# Patient Record
Sex: Male | Born: 1985 | Race: White | Hispanic: No | Marital: Single | Smoking: Current some day smoker
Health system: Southern US, Community
[De-identification: ages and names within clinical notes are randomized; demographics above are authoritative.]

## PROBLEM LIST (undated history)

## (undated) DIAGNOSIS — J45909 Unspecified asthma, uncomplicated: Secondary | ICD-10-CM

## (undated) DIAGNOSIS — IMO0001 Reserved for inherently not codable concepts without codable children: Secondary | ICD-10-CM

## (undated) DIAGNOSIS — F410 Panic disorder [episodic paroxysmal anxiety] without agoraphobia: Secondary | ICD-10-CM

## (undated) DIAGNOSIS — K219 Gastro-esophageal reflux disease without esophagitis: Secondary | ICD-10-CM

## (undated) HISTORY — DX: Panic disorder (episodic paroxysmal anxiety): F41.0

## (undated) HISTORY — DX: Unspecified asthma, uncomplicated: J45.909

## (undated) HISTORY — DX: Gastro-esophageal reflux disease without esophagitis: K21.9

## (undated) HISTORY — DX: Reserved for inherently not codable concepts without codable children: IMO0001

---
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
## 2006-11-03 ENCOUNTER — Emergency Department (HOSPITAL_COMMUNITY): Admission: EM | Admit: 2006-11-03 | Discharge: 2006-11-03 | Payer: Self-pay | Admitting: Emergency Medicine

## 2011-03-03 LAB — CBC
HCT: 49
MCHC: 36
Platelets: 285
RDW: 12

## 2011-03-03 LAB — DIFFERENTIAL
Basophils Absolute: 0
Basophils Relative: 0
Eosinophils Absolute: 0
Eosinophils Relative: 0
Lymphocytes Relative: 10 — ABNORMAL LOW
Lymphs Abs: 1
Monocytes Absolute: 0.3
Monocytes Relative: 3
Neutro Abs: 8.8 — ABNORMAL HIGH
Neutrophils Relative %: 86 — ABNORMAL HIGH

## 2011-03-03 LAB — URINALYSIS, ROUTINE W REFLEX MICROSCOPIC
Bilirubin Urine: NEGATIVE
Glucose, UA: NEGATIVE
Hgb urine dipstick: NEGATIVE
Ketones, ur: 40 — AB
Nitrite: NEGATIVE
Protein, ur: NEGATIVE
Specific Gravity, Urine: 1.02
Urobilinogen, UA: 0.2
pH: 6

## 2011-03-03 LAB — RAPID URINE DRUG SCREEN, HOSP PERFORMED
Amphetamines: NOT DETECTED
Barbiturates: NOT DETECTED
Benzodiazepines: POSITIVE — AB
Cocaine: NOT DETECTED
Opiates: NOT DETECTED
Tetrahydrocannabinol: NOT DETECTED

## 2011-03-03 LAB — ETHANOL: Alcohol, Ethyl (B): <5

## 2011-03-03 LAB — BASIC METABOLIC PANEL WITH GFR
BUN: 10
CO2: 28
Calcium: 9.2
Chloride: 102
Creatinine, Ser: 1.11
GFR calc non Af Amer: >60
Glucose, Bld: 111 — ABNORMAL HIGH
Potassium: 4.2
Sodium: 135

## 2011-06-03 ENCOUNTER — Encounter (HOSPITAL_COMMUNITY): Payer: Self-pay | Admitting: *Deleted

## 2011-06-03 ENCOUNTER — Emergency Department (HOSPITAL_COMMUNITY): Payer: BC Managed Care – PPO

## 2011-06-03 ENCOUNTER — Emergency Department (HOSPITAL_COMMUNITY)
Admission: EM | Admit: 2011-06-03 | Discharge: 2011-06-03 | Disposition: A | Discharge status: Home / Self Care | Payer: BC Managed Care – PPO | Attending: Emergency Medicine | Admitting: Emergency Medicine

## 2011-06-03 DIAGNOSIS — S61219A Laceration without foreign body of unspecified finger without damage to nail, initial encounter: Secondary | ICD-10-CM

## 2011-06-03 DIAGNOSIS — M79609 Pain in unspecified limb: Secondary | ICD-10-CM | POA: Insufficient documentation

## 2011-06-03 DIAGNOSIS — S61209A Unspecified open wound of unspecified finger without damage to nail, initial encounter: Secondary | ICD-10-CM | POA: Insufficient documentation

## 2011-06-03 DIAGNOSIS — W268XXA Contact with other sharp object(s), not elsewhere classified, initial encounter: Secondary | ICD-10-CM | POA: Insufficient documentation

## 2011-06-03 DIAGNOSIS — S62609A Fracture of unspecified phalanx of unspecified finger, initial encounter for closed fracture: Secondary | ICD-10-CM

## 2011-06-03 MED ORDER — TETANUS-DIPHTH-ACELL PERTUSSIS 5-2.5-18.5 LF-MCG/0.5 IM SUSP
INTRAMUSCULAR | Status: AC
  Filled 2011-06-03: qty 0.5

## 2011-06-03 MED ORDER — CEPHALEXIN 500 MG PO CAPS: 500.0000 mg | ORAL_CAPSULE | Freq: Four times a day (QID) | ORAL | Status: AC

## 2011-06-03 MED ORDER — ONDANSETRON 8 MG PO TBDP
8.0000 mg | ORAL_TABLET | Freq: Once | ORAL | Status: AC
  Administered 2011-06-03: 8 mg via ORAL
  Filled 2011-06-03: qty 1

## 2011-06-03 MED ORDER — OXYCODONE-ACETAMINOPHEN 5-325 MG PO TABS: 1.0000 | ORAL_TABLET | ORAL | Status: AC | PRN

## 2011-06-03 MED ORDER — TETANUS-DIPHTHERIA TOXOIDS TD 5-2 LFU IM INJ
INJECTION | INTRAMUSCULAR | Status: AC
  Filled 2011-06-03: qty 0.5

## 2011-06-03 MED ORDER — LIDOCAINE HCL (PF) 1 % IJ SOLN
5.0000 mL | Freq: Once | INTRAMUSCULAR | Status: AC
  Administered 2011-06-03: 5 mL
  Filled 2011-06-03: qty 5

## 2011-06-03 MED ORDER — OXYCODONE-ACETAMINOPHEN 5-325 MG PO TABS
2.0000 | ORAL_TABLET | Freq: Once | ORAL | Status: AC
  Administered 2011-06-03: 0.5 via ORAL
  Filled 2011-06-03 (×2): qty 1

## 2011-06-03 MED ORDER — CEPHALEXIN 500 MG PO CAPS
500.0000 mg | ORAL_CAPSULE | Freq: Once | ORAL | Status: AC
  Administered 2011-06-03: 500 mg via ORAL
  Filled 2011-06-03: qty 1

## 2011-06-03 MED ORDER — TETANUS-DIPHTH-ACELL PERTUSSIS 5-2.5-18.5 LF-MCG/0.5 IM SUSP
0.5000 mL | Freq: Once | INTRAMUSCULAR | Status: AC
  Administered 2011-06-03: 0.5 mL via INTRAMUSCULAR

## 2011-06-03 MED ORDER — TETANUS-DIPHTH-ACELL PERTUSSIS 5-2-15.5 LF-MCG/0.5 IM SUSP: 0.5000 mL | Freq: Once | INTRAMUSCULAR | Status: DC

## 2011-06-03 NOTE — ED Provider Notes (Signed)
History     CSN: [id number]  Arrival date & time 06/03/11  1300   First MD Initiated Contact with Patient 06/03/11 1405      Chief Complaint  Patient presents with  . Laceration     Patient is a 26 y.o. male presenting with skin laceration. The history is provided by the patient.  Laceration  The incident occurred 1 to 2 hours ago. Pain location: right ring finger. The pain is moderate. The pain has been constant since onset. He reports no foreign bodies present. His tetanus status is unknown.  pt reports he was helping a friend work on his car He reports that his finger was lacerated from the belt of the car No other injuries reported He denies cp/back pain/sob/abd pain No weakness is reported  PMH - panic attacks  History reviewed. No pertinent past surgical history.  History reviewed. No pertinent family history.  History  Substance Use Topics  . Smoking status: Former Games developer  . Smokeless tobacco: Not on file  . Alcohol Use: No      Review of Systems  Constitutional: Negative for fever.  Skin: Positive for wound.    Allergies  Review of patient's allergies indicates no known allergies.  Home Medications   Current Outpatient Rx  Name Route Sig Dispense Refill  . PAROXETINE HCL 20 MG PO TABS Oral Take 20 mg by mouth every morning.      BP 154/90  Pulse 99  Temp(Src) 98.3 F (36.8 C) (Oral)  Resp 20  Ht 5\' 10"  (1.778 m)  Wt 230 lb (104.327 kg)  BMI 33.00 kg/m2  SpO2 99%  Physical Exam CONSTITUTIONAL: Well developed/well nourished HEAD AND FACE: Normocephalic/atraumatic EYES: EOMI/PERRL ENMT: Mucous membranes moist NECK: supple no meningeal signs SPINE:entire spine nontender CV: S1/S2 noted, no murmurs/rubs/gallops noted LUNGS: Lungs are clear to auscultation bilaterally, no apparent distress ABDOMEN: soft, nontender, no rebound or guarding GU:no cva tenderness NEURO: Pt is awake/alert, moves all extremitiesx4 EXTREMITIES: pulses normal, full  ROM Right ring finger = laceration noted to distal tip of finger, no exposed bone.  Bleeding controlled No foreign body noted.  The nailbed is intact SKIN: warm, color normal PSYCH: no abnormalities of mood noted  ED Course  NERVE BLOCK Date/Time: 06/03/2011 4:00 PM Performed by: Joya Gaskins Authorized by: Joya Gaskins Consent: Verbal consent obtained. Risks and benefits: risks, benefits and alternatives were discussed Consent given by: patient Indications: pain relief Nerve block body site: right ring finger. Laterality: right Patient sedated: no Preparation: Patient was prepped and draped in the usual sterile fashion. Needle gauge: 24 G Local anesthetic: lidocaine 1% without epinephrine Anesthetic total: 3 ml Outcome: pain improved Patient tolerance: Patient tolerated the procedure well with no immediate complications.     Labs Reviewed - No data to display Dg Finger Ring Right  06/03/2011  *RADIOLOGY REPORT*  Clinical Data: Laceration to right ring finger.  RIGHT RING FINGER 2+V  Comparison: None  Findings: There is a fracture deformity which involves the tuft of the distal phalanx.  The fracture fragments are in near anatomic alignment.  Soft tissue amputation overlying the tuft of the distal phalanx is also noted.  IMPRESSION:  1.  Nondisplaced fracture involves the tuft of the distal phalanx. 2.  Soft tissue amputation overlying the distal phalanx.  Original Report Authenticated By: Rosealee Albee, M.D.   Wound cleansed extensively with high pressure tap water Bleeding controlled No foreign body noted No bony exposed No debridement necessary  3:11 PM D/w dr Hilda Lias Wound care, non-adherent dressing, abx/pain meds and will see next week  4:10 PM D/w dr Merlyn Lot on for hand, cover wound, abx, will see tomorrow  MDM  xrays reviewed and considered         Joya Gaskins, MD 06/03/11 1625

## 2011-06-03 NOTE — ED Notes (Signed)
Pt was helping a coworker work on a car when the coworker started the car and pt's finger became caught in belt, pt has laceration to the right ring finger, top part of finger is missing, pressure dressing applied at triage,

## 2011-06-03 NOTE — ED Notes (Signed)
Pt  Unsure of last tetanus, states that it may have been a few years ago ,.

## 2011-06-03 NOTE — ED Notes (Signed)
Non adhesive dressing applied to right ring finger. Pt instructed to maintain drsg until seen by ortho MD

## 2011-12-12 ENCOUNTER — Ambulatory Visit (INDEPENDENT_AMBULATORY_CARE_PROVIDER_SITE_OTHER): Payer: BC Managed Care – PPO | Admitting: Internal Medicine

## 2011-12-12 VITALS — BP 122/74 | HR 71 | Temp 98.4°F | Resp 18 | Ht 68.0 in | Wt 247.0 lb

## 2011-12-12 DIAGNOSIS — R591 Generalized enlarged lymph nodes: Secondary | ICD-10-CM

## 2011-12-12 DIAGNOSIS — L0291 Cutaneous abscess, unspecified: Secondary | ICD-10-CM

## 2011-12-12 DIAGNOSIS — L0390 Cellulitis, unspecified: Secondary | ICD-10-CM

## 2011-12-12 DIAGNOSIS — R599 Enlarged lymph nodes, unspecified: Secondary | ICD-10-CM

## 2011-12-12 DIAGNOSIS — B009 Herpesviral infection, unspecified: Secondary | ICD-10-CM

## 2011-12-12 MED ORDER — VALACYCLOVIR HCL 1 G PO TABS: 1000.0000 mg | ORAL_TABLET | Freq: Three times a day (TID) | ORAL | Status: DC

## 2011-12-12 NOTE — Progress Notes (Signed)
  Subjective:    Patient ID: Jay Garcia, male    DOB: 04-30-1986, 26 y.o.   MRN: [id number]  HPIStarted with a little discomfort on Wednesday/on Thursday noticed red bumps on his left upper lid Has had pain and swelling and redness in the upper early at with tenderness in the lymph nodes around his left ear and jaw No tingling or numbness No purulent discharge No change in vision No pain in the eye itself   78-year-old at home/Wife works at [employer] Review of Systems Not immunocompromised No history of herpes zoster or herpes simplex No problems in the past On Paxil for panic disorder and Ritalin for ADD    Objective:   Physical Exam Vital signs stable There is a group of erythematous purulent vesicles on the left upper inner lid Conjunctiva on the left is slightly injected After Ophthaine stain is applied/no dendritic lesions and no areas of uptake Vision is intact Pupils are equal round reactive to light and accommodation/EOMs conjugate Only the left upper lid is swollen There are no paresthesias in the distribution of the trigeminal nerve on the left Tender preauricular node Tender anterior cervical noted at the angle of the jaw No oral lesions or lesions in the external auditory canal       Assessment & Plan:  Impression #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 Sol Blazing And we'll make arrangements for the referral

## 2011-12-15 LAB — HERPES SIMPLEX VIRUS CULTURE: Organism ID, Bacteria: DETECTED

## 2012-10-11 ENCOUNTER — Encounter: Payer: Self-pay | Admitting: *Deleted

## 2012-10-16 ENCOUNTER — Encounter: Payer: Self-pay | Admitting: Family Medicine

## 2012-10-16 ENCOUNTER — Ambulatory Visit (INDEPENDENT_AMBULATORY_CARE_PROVIDER_SITE_OTHER): Payer: BC Managed Care – PPO | Admitting: Family Medicine

## 2012-10-16 VITALS — BP 132/90 | Temp 98.7°F | Wt 228.8 lb

## 2012-10-16 DIAGNOSIS — L559 Sunburn, unspecified: Secondary | ICD-10-CM

## 2012-10-16 DIAGNOSIS — F909 Attention-deficit hyperactivity disorder, unspecified type: Secondary | ICD-10-CM

## 2012-10-16 DIAGNOSIS — F410 Panic disorder [episodic paroxysmal anxiety] without agoraphobia: Secondary | ICD-10-CM | POA: Insufficient documentation

## 2012-10-16 MED ORDER — PREDNISONE 20 MG PO TABS: ORAL_TABLET | ORAL | Status: DC

## 2012-10-16 MED ORDER — PAROXETINE HCL 20 MG PO TABS: 20.0000 mg | ORAL_TABLET | ORAL | Status: DC

## 2012-10-16 NOTE — Progress Notes (Signed)
  Subjective:    Patient ID: Jay Garcia, male    DOB: 09/01/1985, 27 y.o.   MRN: [id number]  HPI  adhd had trouble focusing and attention back in school. Was placed on medications. If found that Ritalin helped considerably.  Hx of panic disorder. Hx of severe anxiety and panic attacks. Off the med has difficulty. Had to use of Xanax early on. Now does not. No suicidal thoughts. Without the medication has a lot of difficulty.  Originally started seeing specialist cause less costly, now more so Severe sunburn of late. Got out this weekend did not wear block as much as he should. Review of Systems No chest pain no abdominal pain no shortness of breath    Objective:   Physical Exam Alert no acute distress. HEENT normal. Vitals reviewed. Lungs clear. Heart regular in rhythm. Abdomen benign. Severe sunburn noted with secondary blistering       Assessment & Plan:  Impression 1 ADHD discussed at length. Currently under good control with 10 mg what Ritalin twice per day. #2 panic attack with anxiety clinically stable on Paxil. #3 sunburn severe. Discussed. Plan prednisone taper. 4 Paxil refilled. Diet exercise discussed. Recheck in 4 months. WSLmonths worth written a will and in written.

## 2012-11-13 ENCOUNTER — Encounter: Payer: Self-pay | Admitting: Family Medicine

## 2012-11-13 ENCOUNTER — Ambulatory Visit (INDEPENDENT_AMBULATORY_CARE_PROVIDER_SITE_OTHER): Payer: BC Managed Care – PPO | Admitting: Family Medicine

## 2012-11-13 VITALS — BP 128/78 | Temp 98.4°F | Wt 222.0 lb

## 2012-11-13 DIAGNOSIS — J4550 Severe persistent asthma, uncomplicated: Secondary | ICD-10-CM

## 2012-11-13 DIAGNOSIS — J45909 Unspecified asthma, uncomplicated: Secondary | ICD-10-CM | POA: Insufficient documentation

## 2012-11-13 DIAGNOSIS — J329 Chronic sinusitis, unspecified: Secondary | ICD-10-CM

## 2012-11-13 MED ORDER — ALBUTEROL SULFATE HFA 108 (90 BASE) MCG/ACT IN AERS: 2.0000 | INHALATION_SPRAY | Freq: Four times a day (QID) | RESPIRATORY_TRACT | Status: DC | PRN

## 2012-11-13 MED ORDER — AMOXICILLIN 500 MG PO TABS: ORAL_TABLET | ORAL | Status: DC

## 2012-11-13 NOTE — Progress Notes (Signed)
  Subjective:    Patient ID: Jay Garcia, male    DOB: 11/23/1985, 27 y.o.   MRN: [id number]  Sinusitis This is a new problem. The current episode started in the past 7 days. The problem has been gradually worsening since onset. His pain is at a severity of 5/10. The pain is moderate. Associated symptoms include coughing, sinus pressure and a sore throat.   Cough productive of gunk.  Frontal headache. Diminished energy.  Nausea off and on. along with diarrhea  Review of Systems  HENT: Positive for sore throat and sinus pressure.   Respiratory: Positive for cough.        Objective:   Physical Exam An in alert mild malaise. Vitals reviewed. Frontal tenderness. Pharynx erythematous. Lungs rare wheeze heart regular rate and rhythm.       Assessment & Plan:  Impression rhinosinusitis and bronchitis with exacerbation of reactive airways. Plan a mocks 500 3 times a day, Ventolin inhaler when necessary, and nightly Hycodan when necessary. WSL

## 2012-11-21 ENCOUNTER — Other Ambulatory Visit: Payer: Self-pay | Admitting: Internal Medicine

## 2012-11-22 ENCOUNTER — Other Ambulatory Visit: Payer: Self-pay | Admitting: *Deleted

## 2012-11-22 MED ORDER — AMOXICILLIN 500 MG PO TABS: ORAL_TABLET | ORAL | Status: AC

## 2012-11-22 NOTE — Telephone Encounter (Signed)
Needs OV, or needs to get from PCP

## 2013-02-12 ENCOUNTER — Encounter: Payer: Self-pay | Admitting: Family Medicine

## 2013-02-12 ENCOUNTER — Ambulatory Visit (INDEPENDENT_AMBULATORY_CARE_PROVIDER_SITE_OTHER): Payer: BC Managed Care – PPO | Admitting: Family Medicine

## 2013-02-12 VITALS — BP 128/90 | Temp 98.5°F | Ht 70.0 in | Wt 226.0 lb

## 2013-02-12 DIAGNOSIS — J329 Chronic sinusitis, unspecified: Secondary | ICD-10-CM

## 2013-02-12 MED ORDER — LEVOFLOXACIN 500 MG PO TABS: 500.0000 mg | ORAL_TABLET | Freq: Every day | ORAL | Status: AC

## 2013-02-12 MED ORDER — ALBUTEROL SULFATE HFA 108 (90 BASE) MCG/ACT IN AERS: 2.0000 | INHALATION_SPRAY | Freq: Four times a day (QID) | RESPIRATORY_TRACT | Status: DC | PRN

## 2013-02-12 NOTE — Progress Notes (Signed)
  Subjective:    Patient ID: Jay Garcia, male    DOB: Nov 14, 1985, 27 y.o.   MRN: [id number]  Fever  This is a new problem. The current episode started in the past 7 days. Associated symptoms include coughing, headaches and wheezing. Associated symptoms comments: Runny nose, dizzy, body aches.  worrse cough and wheezing in the chest,  Sig pressure ant face and forehead   Review of Systems  Constitutional: Positive for fever.  Respiratory: Positive for cough and wheezing.   Neurological: Positive for headaches.       Objective:   Physical Exam Alert mild malaise. Vital stable. Lungs clear. Heart regular in rhythm. H&T frontal and maxillary tenderness evident pharynx slight erythema       Assessment & Plan:  Impression rhinosinusitis plan Levaquin daily 10 days. Symptomatic care discussed. Warning signs discussed. WSL

## 2013-02-12 NOTE — Progress Notes (Deleted)
  Subjective:    Patient ID: Jay Garcia, male    DOB: November 17, 1985, 27 y.o.   MRN: [id number]  Cough This is a new problem. Associated symptoms comments: Pulling at ears.   Rash on stomach.   Review of Systems  Respiratory: Positive for cough.        Objective:   Physical Exam        Assessment & Plan:

## 2013-02-16 ENCOUNTER — Telehealth: Payer: Self-pay | Admitting: Family Medicine

## 2013-02-16 MED ORDER — METHYLPHENIDATE HCL 10 MG PO TABS: 10.0000 mg | ORAL_TABLET | Freq: Two times a day (BID) | ORAL | Status: DC

## 2013-02-16 NOTE — Telephone Encounter (Signed)
Last ADD check up 10/16/12

## 2013-02-16 NOTE — Telephone Encounter (Signed)
Rx printed and left up front for patient pick up. Patient notified. 

## 2013-02-16 NOTE — Telephone Encounter (Signed)
Patient needs Rx for Ritalin °

## 2013-02-16 NOTE — Telephone Encounter (Signed)
wis one month's worth--ov before finished

## 2013-02-21 ENCOUNTER — Ambulatory Visit (INDEPENDENT_AMBULATORY_CARE_PROVIDER_SITE_OTHER): Payer: BC Managed Care – PPO | Admitting: Family Medicine

## 2013-02-21 ENCOUNTER — Encounter: Payer: Self-pay | Admitting: Family Medicine

## 2013-02-21 ENCOUNTER — Telehealth: Payer: Self-pay | Admitting: Family Medicine

## 2013-02-21 VITALS — BP 130/94 | Temp 98.9°F | Ht 70.0 in | Wt 220.8 lb

## 2013-02-21 DIAGNOSIS — R21 Rash and other nonspecific skin eruption: Secondary | ICD-10-CM

## 2013-02-21 DIAGNOSIS — J329 Chronic sinusitis, unspecified: Secondary | ICD-10-CM

## 2013-02-21 MED ORDER — TRIAMCINOLONE ACETONIDE 0.1 % EX CREA: TOPICAL_CREAM | Freq: Two times a day (BID) | CUTANEOUS | Status: DC

## 2013-02-21 MED ORDER — AMOXICILLIN-POT CLAVULANATE 875-125 MG PO TABS: 1.0000 | ORAL_TABLET | Freq: Two times a day (BID) | ORAL | Status: AC

## 2013-02-21 NOTE — Progress Notes (Signed)
  Subjective:    Patient ID: Jay Garcia, male    DOB: 27-May-1985, 27 y.o.   MRN: [id number]  Rash This is a new problem. The current episode started in the past 7 days. The affected locations include the face, left arm, left ear, left hand, right hand and right arm. The rash is characterized by itchiness and burning. He was exposed to nothing. Associated symptoms include coughing. Past treatments include antibiotics.   Patient was here last week and was prescribed antibiotics for a respiratory infection.  He also states that his left ear is hot to the touch.  Two d ago rash appeared, noted a know, more iritaate, felt bumps and felt warm  Fine rash now on arms and face , no major itching  Ear felt like  A bite  Patient states that he feels his immune system must be control.  Patient was told that he shingles twice in his left eye. He had a culture done. He had to take acyclovir.had  No topical agents Review of Systems  Respiratory: Positive for cough.   Skin: Positive for rash.   ROS otherwise negative     Objective:   Physical Exam  Alert no acute distress. Lungs clear. Heart regular rate and rhythm. Arms very fine maculopapular rash. Frontal maxillary tenderness persists. 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 Stop Levaquin. New antibiotic prescribed. Triamcinolone twice a day tear. WSL     Assessme:

## 2013-02-21 NOTE — Telephone Encounter (Signed)
Pt calling stating he was seen last week, given a script for Levoquin. He started coughing up blood this morning (just a faint tinge) and wants to know if he should be concerned. He was also bit by a bug/spider yesterday and now has developed a rash all over his arms/hands, ear is swollen with bumps all over it, hot feeling.

## 2013-03-26 ENCOUNTER — Encounter: Payer: BC Managed Care – PPO | Admitting: Family Medicine

## 2013-04-05 ENCOUNTER — Ambulatory Visit (INDEPENDENT_AMBULATORY_CARE_PROVIDER_SITE_OTHER): Payer: BC Managed Care – PPO | Admitting: Family Medicine

## 2013-04-05 ENCOUNTER — Encounter: Payer: Self-pay | Admitting: Family Medicine

## 2013-04-05 VITALS — BP 130/80 | Ht 70.0 in | Wt 227.0 lb

## 2013-04-05 DIAGNOSIS — J45909 Unspecified asthma, uncomplicated: Secondary | ICD-10-CM

## 2013-04-05 DIAGNOSIS — F909 Attention-deficit hyperactivity disorder, unspecified type: Secondary | ICD-10-CM

## 2013-04-05 DIAGNOSIS — J329 Chronic sinusitis, unspecified: Secondary | ICD-10-CM

## 2013-04-05 DIAGNOSIS — F410 Panic disorder [episodic paroxysmal anxiety] without agoraphobia: Secondary | ICD-10-CM

## 2013-04-05 DIAGNOSIS — Z23 Encounter for immunization: Secondary | ICD-10-CM

## 2013-04-05 MED ORDER — METHYLPHENIDATE HCL ER (OSM) 27 MG PO TBCR: 27.0000 mg | EXTENDED_RELEASE_TABLET | ORAL | Status: DC

## 2013-04-05 MED ORDER — AMOXICILLIN-POT CLAVULANATE 875-125 MG PO TABS: 1.0000 | ORAL_TABLET | Freq: Two times a day (BID) | ORAL | Status: AC

## 2013-04-05 NOTE — Progress Notes (Signed)
  Subjective:    Patient ID: Jay Garcia, male    DOB: Oct 13, 1985, 27 y.o.   MRN: [id number]  HPI Patient arrives for an ADHD check up. Patient would like to discuss being on a longer acting med to help him from the day more.   ADHd the reg ritalin, causes short fast rxn and then probably crashes after it  Ran out of the ritalin, on generic paxil.Adderall caused problems, went to rapid acting in the past  Breathing ok, went to tournament and got exposed to the cold. Doesn't do flue shots, but willing to consider.  Headache cough cold productive of yellowish phlegm diminished energy.  Reports mood overall is stable but still has tendency towards anxiety and panic. Also one tries to get off the Paxil has some symptomatology.  States wheezing overall stable uses inhaler only rarely.  Review of Systems No chest pain no back pain no abdominal pain no nausea or diaphoresis no shortness breath ROS otherwise negative    Objective:   Physical Exam Alert lungs clear. Heart regular in rhythm H&T moderate nasal congestion frontal tenderness. Neuro intact. Somewhat anxious appearing.       Assessment & Plan:  Impression ADHD long discussion held. Patient would like to a long-acting form. This is reasonable. #2 rhinosinusitis discussed #3 chronic anxiety with panic attacks clinically stable. #4 asthma clinically stable Plan stop Ritalin. Start Concerta 27 mg by mouth every morning. 4 months worth written. Augmentin twice a day 10 days. Flu shot today. Recheck in 4 months. WSL

## 2013-05-18 ENCOUNTER — Telehealth: Payer: Self-pay | Admitting: Family Medicine

## 2013-05-18 MED ORDER — ONDANSETRON 8 MG PO TBDP: 8.0000 mg | ORAL_TABLET | Freq: Three times a day (TID) | ORAL | Status: DC | PRN

## 2013-05-18 NOTE — Telephone Encounter (Signed)
Fever off and on for 2 days, not sure how high he can just tell he has fever, taking ibuprofen, he is drinking fluids, not eating much today he has ate 3-4 crackers, did not eat yesterday, has only urinated one time today none yesterday. Vomiting and diarrhea 10- 15 times a day until today. He has not voimitied today still having diarhhea. No blood in diarrhea or vomit but he is coughing up dark mucus. Consult with Dr. Lorin PicketScott call in zofran 8mg  #18 oneTID and OTC immodium. If no better by am needs to go to ER.

## 2013-05-18 NOTE — Telephone Encounter (Signed)
PT has had vomiting for 2 days now, can we call something in for this? Like zofran?   Also his eyes seem to have burst blood vessels in them now from the  Vomiting. Is there anything we can do for this?  [employer]

## 2013-05-18 NOTE — Telephone Encounter (Signed)
NTC- discuss case, warning signs? Fever, bloody stool or bloody vomitis? Sipping liuquids? ( Zofran can be sent in ) NTC first

## 2013-05-21 ENCOUNTER — Ambulatory Visit (INDEPENDENT_AMBULATORY_CARE_PROVIDER_SITE_OTHER): Payer: BC Managed Care – PPO | Admitting: Family Medicine

## 2013-05-21 ENCOUNTER — Encounter: Payer: Self-pay | Admitting: Family Medicine

## 2013-05-21 ENCOUNTER — Other Ambulatory Visit: Payer: Self-pay | Admitting: Family Medicine

## 2013-05-21 VITALS — BP 130/88 | Temp 98.4°F | Ht 70.0 in | Wt 220.0 lb

## 2013-05-21 DIAGNOSIS — H00039 Abscess of eyelid unspecified eye, unspecified eyelid: Secondary | ICD-10-CM

## 2013-05-21 DIAGNOSIS — B349 Viral infection, unspecified: Secondary | ICD-10-CM

## 2013-05-21 DIAGNOSIS — R112 Nausea with vomiting, unspecified: Secondary | ICD-10-CM

## 2013-05-21 DIAGNOSIS — B9789 Other viral agents as the cause of diseases classified elsewhere: Secondary | ICD-10-CM

## 2013-05-21 DIAGNOSIS — H00036 Abscess of eyelid left eye, unspecified eyelid: Secondary | ICD-10-CM

## 2013-05-21 LAB — POCT URINALYSIS DIPSTICK
PH UA: 7
Urobilinogen, UA: 4

## 2013-05-21 MED ORDER — ONDANSETRON 8 MG PO TBDP
8.0000 mg | ORAL_TABLET | Freq: Three times a day (TID) | ORAL | Status: DC | PRN
Start: 2013-05-21 — End: 2014-11-08

## 2013-05-21 NOTE — Progress Notes (Signed)
   Subjective:    Patient ID: Jay Garcia, male    DOB: 10/12/1985, 28 y.o.   MRN: [id number]  HPIHaving vomiting, diarrhea, and fever for 5-6 days. Patient also with some runny nose sore throat slight cough flulike illness for several days he states he started to feel better other family members had it but not as bad as he had  Blood vessels in both eyes started after the vomiting. This occurred after heavy vomiting.   Painful bump above left eye. Pt states he has had shingles around eye before. Patient would like to have this test.    Review of Systems He relates he had a few days where he did urinate well but now he is urinating better. He also states he is able to keep some things down. He-related mainly flulike symptoms    Objective:   Physical Exam He has scleral hemorrhage is consistent with his vomiting. Should gradually get better he also has a bump over the left thigh which we cultured for varus Sal as well as MRSA await results of the culture Lungs clear hearts regular pulse normal extremities no edema skin warm dry neurologic grossly normal Urine does not show ketones Patient is not toxic neck is supple color is good     Assessment & Plan:  Viral syndrome probable flu gradually getting better I think the next approach for this gentleman is Zofran plenty of liquids and if not turning the corner over the next 48 hours he will need some lab work. Warning signs was discussed with the patient.

## 2013-05-24 LAB — WOUND CULTURE
Gram Stain: NONE SEEN
Gram Stain: NONE SEEN
Gram Stain: NONE SEEN
ORGANISM ID, BACTERIA: NO GROWTH

## 2013-05-28 LAB — HERPES CULTURE, RAPID: ORGANISM ID, BACTERIA: NEGATIVE

## 2013-05-29 ENCOUNTER — Encounter: Payer: Self-pay | Admitting: Family Medicine

## 2013-09-05 ENCOUNTER — Telehealth: Payer: Self-pay | Admitting: Family Medicine

## 2013-09-05 NOTE — Telephone Encounter (Signed)
Pt requesting refill  methylphenidate (CONCERTA) 27 MG CR tablet   Please call when ready [phone]

## 2013-09-05 NOTE — Telephone Encounter (Signed)
Last seen: 11/14/

## 2013-09-05 NOTE — Telephone Encounter (Signed)
Ok times one mo, needs ov

## 2013-09-06 MED ORDER — METHYLPHENIDATE HCL ER (OSM) 27 MG PO TBCR: 27.0000 mg | EXTENDED_RELEASE_TABLET | ORAL | Status: DC

## 2013-09-06 NOTE — Telephone Encounter (Signed)
Pt notified script ready for pick up and he needs office visit before further.

## 2013-09-06 NOTE — Telephone Encounter (Signed)
[facility] script ready for pickup. Script at front desk.

## 2013-09-18 ENCOUNTER — Encounter: Payer: Self-pay | Admitting: Family Medicine

## 2013-09-18 ENCOUNTER — Ambulatory Visit (INDEPENDENT_AMBULATORY_CARE_PROVIDER_SITE_OTHER): Payer: BC Managed Care – PPO | Admitting: Family Medicine

## 2013-09-18 VITALS — Ht 70.0 in

## 2013-09-18 DIAGNOSIS — J45909 Unspecified asthma, uncomplicated: Secondary | ICD-10-CM

## 2013-09-18 DIAGNOSIS — F410 Panic disorder [episodic paroxysmal anxiety] without agoraphobia: Secondary | ICD-10-CM

## 2013-09-18 DIAGNOSIS — F909 Attention-deficit hyperactivity disorder, unspecified type: Secondary | ICD-10-CM

## 2013-09-18 MED ORDER — ONDANSETRON HCL 8 MG PO TABS: 8.0000 mg | ORAL_TABLET | Freq: Three times a day (TID) | ORAL | Status: DC | PRN

## 2013-09-18 MED ORDER — DIPHENOXYLATE-ATROPINE 2.5-0.025 MG PO TABS: 1.0000 | ORAL_TABLET | Freq: Four times a day (QID) | ORAL | Status: DC | PRN

## 2013-09-18 NOTE — Progress Notes (Signed)
   Subjective:    Patient ID: Jay Garcia, male    DOB: 10/20/1985, 28 y.o.   MRN: [id number]  Emesis  This is a new problem. The current episode started yesterday. Associated symptoms include diarrhea.    Felt really bad with vom and diarrhea  Got to work and started with bad nausea and then threw up again, ande felt nauseated  Tried to force the vomiting  Pushed back of the tongue and felt knots and bumps   Started to worry  Felt clammy cool and hot, felt sweaty  No other exp in the past  Patient extremely anxious about the bumps that he has found that the back of his tongue. Worried about cancer.   Review of Systems  Gastrointestinal: Positive for vomiting and diarrhea.   no chest pain no back pain no abdominal pain no rash no change in bowel habits no blood in stool ROS otherwise negative     Objective:   Physical Exam  Alert moderate malaise anxious appearing HEENT normal pharyngeal anatomy. Normal tongue anatomy. Neck supple. Lungs clear. Heart rare in rhythm. Abdomen hyperactive bowel sounds. Diffuse upper abdominal mild discomfort to palpation no acute tenderness no rebound no guarding      Assessment & Plan:  Impression 1 acute gastroenteritis discussed #2 normal tongue anatomy with the patient anxiety #3 chronic anxiety discussed plan multiple questions answered. 25 minutes spent most in discussion. Warning signs discussed. Symptomatic care discussed. Zofran when necessary for nausea. Lomotil when necessary for any diarrhea. Dietary changes discussed. 25 minutes spent most in discussing concerns. WSL

## 2013-09-28 ENCOUNTER — Other Ambulatory Visit: Payer: Self-pay | Admitting: Family Medicine

## 2013-11-13 ENCOUNTER — Ambulatory Visit: Payer: BC Managed Care – PPO | Admitting: Nurse Practitioner

## 2014-01-30 ENCOUNTER — Other Ambulatory Visit: Payer: Self-pay | Admitting: Family Medicine

## 2014-03-13 ENCOUNTER — Other Ambulatory Visit: Payer: Self-pay | Admitting: Family Medicine

## 2014-06-20 ENCOUNTER — Other Ambulatory Visit: Payer: Self-pay | Admitting: Family Medicine

## 2014-08-05 ENCOUNTER — Other Ambulatory Visit: Payer: Self-pay | Admitting: Family Medicine

## 2014-11-08 ENCOUNTER — Encounter: Payer: Self-pay | Admitting: Family Medicine

## 2014-11-08 ENCOUNTER — Ambulatory Visit (INDEPENDENT_AMBULATORY_CARE_PROVIDER_SITE_OTHER): Payer: Self-pay | Admitting: Family Medicine

## 2014-11-08 VITALS — BP 122/80 | Temp 98.4°F | Ht 70.0 in | Wt 199.0 lb

## 2014-11-08 DIAGNOSIS — J019 Acute sinusitis, unspecified: Secondary | ICD-10-CM

## 2014-11-08 DIAGNOSIS — F410 Panic disorder [episodic paroxysmal anxiety] without agoraphobia: Secondary | ICD-10-CM

## 2014-11-08 DIAGNOSIS — J4520 Mild intermittent asthma, uncomplicated: Secondary | ICD-10-CM

## 2014-11-08 MED ORDER — CITALOPRAM HYDROBROMIDE 20 MG PO TABS: 20.0000 mg | ORAL_TABLET | Freq: Every day | ORAL | Status: DC

## 2014-11-08 MED ORDER — ALBUTEROL SULFATE HFA 108 (90 BASE) MCG/ACT IN AERS: 2.0000 | INHALATION_SPRAY | Freq: Four times a day (QID) | RESPIRATORY_TRACT | Status: DC | PRN

## 2014-11-08 MED ORDER — AZITHROMYCIN 250 MG PO TABS: ORAL_TABLET | ORAL | Status: DC

## 2014-11-08 MED ORDER — NYSTATIN 100000 UNIT/ML MT SUSP: 5.0000 mL | Freq: Four times a day (QID) | OROMUCOSAL | Status: DC

## 2014-11-08 NOTE — Progress Notes (Signed)
   Subjective:    Patient ID: Jay Garcia, male    DOB: June 28, 1985, 29 y.o.   MRN: [id number]  Cough This is a new problem. Episode onset: 4 -5 days ago. Associated symptoms include headaches, nasal congestion, shortness of breath and wheezing. Treatments tried: mucinex dm.   Pt states he does not have insurance so he has not had his concerta and paxil in months. He states he is having withdrawal symptoms.   Yeast infection in mouth for the past 6 months - 1 year. Has tried home remedies and otc meds.   Review of Systems  Respiratory: Positive for cough, shortness of breath and wheezing.   Neurological: Positive for headaches.       Objective:   Physical Exam  Eardrums normal throat is normal possible white patches on tongue minimal low. Lungs bilateral expiratory wheezes chest congestion noted no sign of pneumonia heart regular mild sinus tenderness     patient admits to being anxious and nervous Assessment & Plan:  Anxiety condition start Celexa 20 mg half tablet daily then 1 tablet daily he was on Paxil but he would get withdrawals every time he stopped it.  Reactive airway not severe. Only when he doesn't deep exhalation. I recommend albuterol 2 puffs every 4 hours when necessary I don't feel need to be on prednisone currently if not improved over the next few days call us  Combination sinus infection or bronchitis Zithromax 5 days as directed  Possible thrush nystatin  I discussed with patient importance of following up with Dr. Brett Canales to discuss his anxiety issues he's been without insurance and been all medicines for over 4 months. He was on. Patient denies being depressed.

## 2014-11-08 NOTE — Addendum Note (Signed)
Addended by: Margaretha Sheffield on: 11/08/2014 02:43 PM   Modules accepted: Orders

## 2014-12-13 ENCOUNTER — Ambulatory Visit: Payer: Self-pay | Admitting: Family Medicine

## 2015-05-13 ENCOUNTER — Encounter: Payer: Self-pay | Admitting: Nurse Practitioner

## 2015-05-13 ENCOUNTER — Encounter: Payer: Self-pay | Admitting: Family Medicine

## 2015-05-13 ENCOUNTER — Ambulatory Visit (INDEPENDENT_AMBULATORY_CARE_PROVIDER_SITE_OTHER): Payer: PRIVATE HEALTH INSURANCE | Admitting: Nurse Practitioner

## 2015-05-13 VITALS — BP 118/78 | Temp 99.6°F | Ht 70.0 in | Wt 194.5 lb

## 2015-05-13 DIAGNOSIS — J0100 Acute maxillary sinusitis, unspecified: Secondary | ICD-10-CM | POA: Diagnosis not present

## 2015-05-13 DIAGNOSIS — K219 Gastro-esophageal reflux disease without esophagitis: Secondary | ICD-10-CM

## 2015-05-13 MED ORDER — ALBUTEROL SULFATE HFA 108 (90 BASE) MCG/ACT IN AERS: 2.0000 | INHALATION_SPRAY | Freq: Four times a day (QID) | RESPIRATORY_TRACT | Status: DC | PRN

## 2015-05-13 MED ORDER — RANITIDINE HCL 300 MG PO TABS: 300.0000 mg | ORAL_TABLET | Freq: Every day | ORAL | Status: DC

## 2015-05-13 MED ORDER — AZITHROMYCIN 250 MG PO TABS: ORAL_TABLET | ORAL | Status: DC

## 2015-05-13 NOTE — Patient Instructions (Signed)
Food Choices for Gastroesophageal Reflux Disease, Adult When you have gastroesophageal reflux disease (GERD), the foods you eat and your eating habits are very important. Choosing the right foods can help ease the discomfort of GERD. WHAT GENERAL GUIDELINES DO I NEED TO FOLLOW?  Choose fruits, vegetables, whole grains, low-fat dairy products, and low-fat meat, fish, and poultry.  Limit fats such as oils, salad dressings, butter, nuts, and avocado.  Keep a food diary to identify foods that cause symptoms.  Avoid foods that cause reflux. These may be different for different people.  Eat frequent small meals instead of three large meals each day.  Eat your meals slowly, in a relaxed setting.  Limit fried foods.  Cook foods using methods other than frying.  Avoid drinking alcohol.  Avoid drinking large amounts of liquids with your meals.  Avoid bending over or lying down until 2-3 hours after eating. WHAT FOODS ARE NOT RECOMMENDED? The following are some foods and drinks that may worsen your symptoms: Vegetables Tomatoes. Tomato juice. Tomato and spaghetti sauce. Chili peppers. Onion and garlic. Horseradish. Fruits Oranges, grapefruit, and lemon (fruit and juice). Meats High-fat meats, fish, and poultry. This includes hot dogs, ribs, ham, sausage, salami, and bacon. Dairy Whole milk and chocolate milk. Sour cream. Cream. Butter. Ice cream. Cream cheese.  Beverages Coffee and tea, with or without caffeine. Carbonated beverages or energy drinks. Condiments Hot sauce. Barbecue sauce.  Sweets/Desserts Chocolate and cocoa. Donuts. Peppermint and spearmint. Fats and Oils High-fat foods, including French fries and potato chips. Other Vinegar. Strong spices, such as black pepper, white pepper, red pepper, cayenne, curry powder, cloves, ginger, and chili powder. The items listed above may not be a complete list of foods and beverages to avoid. Contact your dietitian for more  information.   This information is not intended to replace advice given to you by your health care provider. Make sure you discuss any questions you have with your health care provider.   Document Released: 05/03/2005 Document Revised: 05/24/2014 Document Reviewed: 03/07/2013 Elsevier Interactive Patient Education 2016 [employer]  

## 2015-05-14 ENCOUNTER — Other Ambulatory Visit: Payer: Self-pay | Admitting: Family Medicine

## 2015-05-14 ENCOUNTER — Encounter: Payer: Self-pay | Admitting: Nurse Practitioner

## 2015-05-14 DIAGNOSIS — K219 Gastro-esophageal reflux disease without esophagitis: Secondary | ICD-10-CM | POA: Insufficient documentation

## 2015-05-14 NOTE — Progress Notes (Signed)
Subjective:  Presents for c/o cough, fever and congestion x 4 d. Sore throat. Maxillary area headache radiating into the teeth. Runny nose. Frequent cough. Tightness in the lower sternal/upper epigastric area. Wheezing at times especially with prolonged cough. No ear pain. No vomiting or diarrhea. Frequent reflux for about a week. Has a history of GERD. Non smoker. Drinks a large amount of caffeine. Rare alcohol use. No NSAIDs.   Objective:   BP 118/78 mmHg  Temp(Src) 99.6 F (37.6 C) (Oral)  Ht 5\' 10"  (1.778 m)  Wt 194 lb 8 oz (88.225 kg)  BMI 27.91 kg/m2 NAD. Alert, oriented. TMs clear effusion. Pharynx non erythematous with PND noted. Neck supple with mild anterior adenopathy. Lungs clear. Heart RRR. Abdomen soft, non distended, non tender.   Assessment:  Problem List Items Addressed This Visit      Digestive   Gastroesophageal reflux disease without esophagitis   Relevant Medications   ranitidine (ZANTAC) 300 MG tablet    Other Visit Diagnoses    Acute maxillary sinusitis, recurrence not specified    -  Primary    Relevant Medications    azithromycin (ZITHROMAX Z-PAK) 250 MG tablet      Plan:  Meds ordered this encounter  Medications  . albuterol (PROVENTIL HFA;VENTOLIN HFA) 108 (90 Base) MCG/ACT inhaler    Sig: Inhale 2 puffs into the lungs every 6 (six) hours as needed for wheezing.    Dispense:  1 Inhaler    Refill:  2    Order Specific Question:  Supervising Provider    Answer:  Merlyn AlbertLUKING, WILLIAM S [[id number]]  . azithromycin (ZITHROMAX Z-PAK) 250 MG tablet    Sig: Take 2 tablets (500 mg) on  Day 1,  followed by 1 tablet (250 mg) once daily on Days 2 through 5.    Dispense:  6 each    Refill:  0    Order Specific Question:  Supervising Provider    Answer:  Merlyn AlbertLUKING, WILLIAM S [[id number]]  . ranitidine (ZANTAC) 300 MG tablet    Sig: Take 1 tablet (300 mg total) by mouth at bedtime.    Dispense:  30 tablet    Refill:  5    Order Specific Question:  Supervising Provider    Answer:   Merlyn AlbertLUKING, WILLIAM S [[id number]]   Slowly wean off caffeine. Given information on diet for GERD. OTC meds as directed for cough and congestion. Call back in 7-10 days if no improvement, sooner if worse.

## 2015-05-20 ENCOUNTER — Telehealth: Payer: Self-pay | Admitting: Family Medicine

## 2015-05-20 MED ORDER — AMOXICILLIN-POT CLAVULANATE 875-125 MG PO TABS: 1.0000 | ORAL_TABLET | Freq: Two times a day (BID) | ORAL | Status: AC

## 2015-05-20 NOTE — Telephone Encounter (Signed)
Patient was seen on 12/27 and given zpack but still sick finished it three days ago and has cough ,congestion.Wanting something else called into [facility] [place].

## 2015-05-20 NOTE — Addendum Note (Signed)
Addended by: Dereck LigasJOHNSON, Layton Tappan P on: 05/20/2015 09:22 AM   Modules accepted: Orders

## 2015-05-20 NOTE — Telephone Encounter (Signed)
Med sent to pharmacy. Patient was notified.  

## 2015-05-20 NOTE — Telephone Encounter (Signed)
Aug 875 bid ten d 

## 2015-05-20 NOTE — Telephone Encounter (Signed)
Seen 05/13/15, diagnosed with acute maxillary sinusitis given Zpak

## 2015-06-09 ENCOUNTER — Telehealth: Payer: Self-pay | Admitting: Family Medicine

## 2015-06-09 NOTE — Telephone Encounter (Signed)
appt made

## 2015-06-09 NOTE — Telephone Encounter (Signed)
methylphenidate (CONCERTA) 27 MG CR tablet  Pt states he has not been able to fill this med in a while due To not having insurance but has it now an wants to know if you  Can go ahead and get him another script for it please.   Please advise

## 2015-06-09 NOTE — Telephone Encounter (Signed)
Called patient and informed her per Dr.Steve Luking-Last visit over 1.5 yrs ago on this needs office visit first. Patient verbalized understanding.

## 2015-06-09 NOTE — Telephone Encounter (Signed)
Last visit over 1.5 yrs ago on this needs o v dfirst

## 2015-06-23 ENCOUNTER — Encounter: Payer: No Typology Code available for payment source | Admitting: Family Medicine

## 2015-06-23 DIAGNOSIS — Z029 Encounter for administrative examinations, unspecified: Secondary | ICD-10-CM

## 2015-06-28 ENCOUNTER — Other Ambulatory Visit: Payer: Self-pay | Admitting: Family Medicine

## 2015-07-29 ENCOUNTER — Ambulatory Visit (INDEPENDENT_AMBULATORY_CARE_PROVIDER_SITE_OTHER): Payer: No Typology Code available for payment source | Admitting: Family Medicine

## 2015-07-29 ENCOUNTER — Encounter: Payer: Self-pay | Admitting: Family Medicine

## 2015-07-29 VITALS — BP 122/80 | Temp 98.8°F | Ht 70.0 in | Wt 207.0 lb

## 2015-07-29 DIAGNOSIS — J111 Influenza due to unidentified influenza virus with other respiratory manifestations: Secondary | ICD-10-CM | POA: Diagnosis not present

## 2015-07-29 MED ORDER — HYDROCODONE-HOMATROPINE 5-1.5 MG/5ML PO SYRP: 5.0000 mL | ORAL_SOLUTION | Freq: Four times a day (QID) | ORAL | Status: DC | PRN

## 2015-07-29 MED ORDER — ONDANSETRON 4 MG PO TBDP: 4.0000 mg | ORAL_TABLET | Freq: Four times a day (QID) | ORAL | Status: DC | PRN

## 2015-07-29 MED ORDER — OSELTAMIVIR PHOSPHATE 75 MG PO CAPS: ORAL_CAPSULE | ORAL | Status: DC

## 2015-07-29 NOTE — Progress Notes (Signed)
   Subjective:    Patient ID: Jay Garcia, male    DOB: 10/27/1985, 30 y.o.   MRN: [id number]  Cough This is a new problem. Episode onset: 2 days ago. Associated symptoms include a fever, headaches, myalgias, nasal congestion and a sore throat. Associated symptoms comments: Nausea . Treatments tried: ibuprofen.   Pt wants to restart concerta. Pt advised he will need a separate office visit for ADD check up.   Yesterday hit hard, felt bad  Very sig cough  Diminished energy  Aching all ovrer and chills  Took ibu  tmax was 101 first day    Takes ibuprofen took three , two more at night as needed   Very bad cough  Energy low appetite lw  Felt nauset Review of Systems  Constitutional: Positive for fever.  HENT: Positive for sore throat.   Respiratory: Positive for cough.   Musculoskeletal: Positive for myalgias.  Neurological: Positive for headaches.       Objective:   Physical Exam  Alert mild malaise positive fever. Vitals stable. Hydration decent TMs good pharynx decent neck supple lungs intermittent bronchial cough no crackles or wheezes heart regular rate and rhythm    Assessment & Plan:  Impression influenza plan Tamiflu twice a day 5 days. Hycodan when necessary side effects discussed, Zofran when necessary WSL

## 2015-08-17 ENCOUNTER — Other Ambulatory Visit: Payer: Self-pay | Admitting: Family Medicine

## 2015-10-04 ENCOUNTER — Other Ambulatory Visit: Payer: Self-pay | Admitting: Family Medicine

## 2015-10-06 NOTE — Telephone Encounter (Signed)
15 d worth not seen on this since last june

## 2015-12-16 ENCOUNTER — Encounter: Payer: Self-pay | Admitting: Nurse Practitioner

## 2015-12-16 ENCOUNTER — Ambulatory Visit (INDEPENDENT_AMBULATORY_CARE_PROVIDER_SITE_OTHER): Payer: No Typology Code available for payment source | Admitting: Nurse Practitioner

## 2015-12-16 VITALS — BP 122/80 | Ht 70.0 in | Wt 199.4 lb

## 2015-12-16 DIAGNOSIS — F419 Anxiety disorder, unspecified: Secondary | ICD-10-CM

## 2015-12-16 DIAGNOSIS — F902 Attention-deficit hyperactivity disorder, combined type: Secondary | ICD-10-CM

## 2015-12-16 DIAGNOSIS — F411 Generalized anxiety disorder: Secondary | ICD-10-CM | POA: Insufficient documentation

## 2015-12-16 MED ORDER — METHYLPHENIDATE HCL ER (OSM) 27 MG PO TBCR: 27.0000 mg | EXTENDED_RELEASE_TABLET | ORAL | 0 refills | Status: DC

## 2015-12-16 MED ORDER — PAROXETINE HCL 20 MG PO TABS: 20.0000 mg | ORAL_TABLET | Freq: Every day | ORAL | 2 refills | Status: DC

## 2015-12-18 ENCOUNTER — Encounter: Payer: Self-pay | Admitting: Nurse Practitioner

## 2015-12-18 NOTE — Progress Notes (Signed)
Subjective:  Has been off Paxil and Concerta for awhile. Would like to restart. Tried Celexa but this did not help anxiety. Paxil worked better. Having difficulty focusing at work. Denies suicidal or homicidal thoughts or ideation.   Objective:   BP 122/80   Ht 5\' 10"  (1.778 m)   Wt 199 lb 6 oz (90.4 kg)   BMI 28.61 kg/m  NAD. Alert, oriented. Lungs clear. Heart RRR. Mildly anxious affect. Thoughts logical, coherent and relevant.   Assessment:  Problem List Items Addressed This Visit      Other   ADHD (attention deficit hyperactivity disorder) - Primary   Anxiety   Relevant Medications   PARoxetine (PAXIL) 20 MG tablet    Other Visit Diagnoses   None.    Plan:  Meds ordered this encounter  Medications  . DISCONTD: methylphenidate 27 MG PO CR tablet    Sig: Take 1 tablet (27 mg total) by mouth every morning.    Dispense:  30 tablet    Refill:  0    May fill 60 days from 12/16/15    Order Specific Question:   Supervising Provider    Answer:   Merlyn Albert [[id number]]  . DISCONTD: methylphenidate 27 MG PO CR tablet    Sig: Take 1 tablet (27 mg total) by mouth every morning.    Dispense:  30 tablet    Refill:  0    May fill 30 days from 12/16/15    Order Specific Question:   Supervising Provider    Answer:   Merlyn Albert [[id number]]  . methylphenidate 27 MG PO CR tablet    Sig: Take 1 tablet (27 mg total) by mouth every morning.    Dispense:  30 tablet    Refill:  0    Order Specific Question:   Supervising Provider    Answer:   Merlyn Albert [[id number]]  . PARoxetine (PAXIL) 20 MG tablet    Sig: Take 1 tablet (20 mg total) by mouth daily.    Dispense:  30 tablet    Refill:  2    Order Specific Question:   Supervising Provider    Answer:   Merlyn Albert [[id number]]   Return in about 3 months (around 03/17/2016).

## 2015-12-30 ENCOUNTER — Telehealth: Payer: Self-pay | Admitting: Nurse Practitioner

## 2015-12-30 NOTE — Telephone Encounter (Signed)
Pt [facility] to [facility] a possible side effect to the new medication that he is on. Informed pt that Eber JonesCarolyn is not here today. Please advise.

## 2015-12-30 NOTE — Telephone Encounter (Signed)
Tried to call - no answer / voicemail box full.

## 2016-01-02 NOTE — Telephone Encounter (Signed)
Patient states that he is having trouble with his acid reflux flaring up again and [facility] [place]

## 2016-01-03 ENCOUNTER — Other Ambulatory Visit: Payer: Self-pay | Admitting: Nurse Practitioner

## 2016-01-03 MED ORDER — ESOMEPRAZOLE MAGNESIUM 40 MG PO CPDR: DELAYED_RELEASE_CAPSULE | ORAL | 2 refills | Status: DC

## 2016-01-03 NOTE — Telephone Encounter (Signed)
Done

## 2016-01-23 ENCOUNTER — Other Ambulatory Visit: Payer: Self-pay | Admitting: Family Medicine

## 2016-01-23 ENCOUNTER — Other Ambulatory Visit: Payer: Self-pay | Admitting: *Deleted

## 2016-01-23 NOTE — Telephone Encounter (Signed)
Please bring back scripts and we will replace them with generic.

## 2016-01-23 NOTE — Telephone Encounter (Signed)
Patient carried his prescription to pharmacy from office visit on 8/1 they filled the first prescription but he states now they wont fill the second one they need the generic brand of concerta before they will fill it.

## 2016-01-23 NOTE — Telephone Encounter (Signed)
Tried to call no answer. Voicemail box is full 

## 2016-01-23 NOTE — Telephone Encounter (Signed)
Pt seen 8/1. Pt states he got three scripts of concerta that day. Took one to pharm and they would not fill because it was name brand. Switched it for generic. Now he has the other two scripts of concerta that he needs to switch for generic.

## 2016-01-30 NOTE — Telephone Encounter (Signed)
[facility] (voice mail box is full)

## 2016-01-30 NOTE — Telephone Encounter (Signed)
[facility] who states patient's insurance covers brand name only but they can fill these scripts a written. They would like patient to bring script to pharmacy

## 2016-01-30 NOTE — Telephone Encounter (Signed)
[facility] (mailbox is full) 

## 2016-03-11 ENCOUNTER — Encounter: Payer: Self-pay | Admitting: Nurse Practitioner

## 2016-03-11 DIAGNOSIS — Z0289 Encounter for other administrative examinations: Secondary | ICD-10-CM

## 2016-03-17 ENCOUNTER — Telehealth: Payer: Self-pay | Admitting: Family Medicine

## 2016-03-17 NOTE — Telephone Encounter (Signed)
Tried to call no answer

## 2016-03-17 NOTE — Telephone Encounter (Signed)
Received fax from pharmacy stating that patient plan does not cover Methyphenidate 27 MG PO CR tablets. Please advise?

## 2016-03-17 NOTE — Telephone Encounter (Signed)
Pt was told aug 1 to f u in three mo, rec pt be seen for disc

## 2016-03-19 NOTE — Telephone Encounter (Signed)
Discussed with pt. Pt states he will call back to schedule follow up office visit.

## 2016-04-01 ENCOUNTER — Other Ambulatory Visit: Payer: Self-pay | Admitting: Nurse Practitioner

## 2016-05-04 ENCOUNTER — Encounter: Payer: Self-pay | Admitting: Family Medicine

## 2016-05-23 ENCOUNTER — Other Ambulatory Visit: Payer: Self-pay | Admitting: Nurse Practitioner

## 2016-06-04 ENCOUNTER — Other Ambulatory Visit: Payer: Self-pay | Admitting: Family Medicine

## 2016-06-29 ENCOUNTER — Ambulatory Visit (INDEPENDENT_AMBULATORY_CARE_PROVIDER_SITE_OTHER): Payer: 59 | Admitting: Family Medicine

## 2016-06-29 ENCOUNTER — Encounter: Payer: Self-pay | Admitting: Family Medicine

## 2016-06-29 VITALS — BP 122/88 | Temp 100.8°F | Ht 70.0 in | Wt 205.0 lb

## 2016-06-29 DIAGNOSIS — J111 Influenza due to unidentified influenza virus with other respiratory manifestations: Secondary | ICD-10-CM | POA: Diagnosis not present

## 2016-06-29 MED ORDER — OSELTAMIVIR PHOSPHATE 75 MG PO CAPS: 75.0000 mg | ORAL_CAPSULE | Freq: Two times a day (BID) | ORAL | 0 refills | Status: DC

## 2016-06-29 MED ORDER — HYDROCODONE-HOMATROPINE 5-1.5 MG/5ML PO SYRP: ORAL_SOLUTION | ORAL | 0 refills | Status: DC

## 2016-06-29 NOTE — Progress Notes (Signed)
   Subjective:    Patient ID: Jay Garcia, male    DOB: 11/16/1985, 31 y.o.   MRN: [id number]  Sinusitis  This is a new problem. Episode onset: 2 days. Associated symptoms include congestion, coughing and headaches. (Fever, wheezing, body aches (legs)) Treatments tried: ibuprofen.   Two cough and cong days  Bad headache   Wheezing   Energy zero   Appetite zer  Denny PeonAvery achey overall 'hi fdvers at time s    Review of Systems  HENT: Positive for congestion.   Respiratory: Positive for cough.   Neurological: Positive for headaches.       Objective:   Physical Exam Alert vitals reviewed, moderate malaise. Hydration good. Positive nasal congestion lungs no crackles or wheezes, no tachypnea, intermittent bronchial cough during exam heart regular rate and rhythm.        Assessment & Plan:  Impression influenza discussed at length. Ashby Dawesature of illness and potential sequela discussed. Plan Tamiflu prescribed if indicated and timing appropriate. Symptom care discussed. Warning signs discussed. WSL

## 2016-07-05 ENCOUNTER — Encounter: Payer: Self-pay | Admitting: Family Medicine

## 2016-07-05 ENCOUNTER — Ambulatory Visit (INDEPENDENT_AMBULATORY_CARE_PROVIDER_SITE_OTHER): Payer: 59 | Admitting: Family Medicine

## 2016-07-05 VITALS — BP 122/80 | Temp 98.4°F | Ht 70.0 in | Wt 194.0 lb

## 2016-07-05 DIAGNOSIS — R509 Fever, unspecified: Secondary | ICD-10-CM | POA: Diagnosis not present

## 2016-07-05 DIAGNOSIS — F419 Anxiety disorder, unspecified: Secondary | ICD-10-CM

## 2016-07-05 DIAGNOSIS — J189 Pneumonia, unspecified organism: Secondary | ICD-10-CM

## 2016-07-05 DIAGNOSIS — J181 Lobar pneumonia, unspecified organism: Secondary | ICD-10-CM | POA: Diagnosis not present

## 2016-07-05 MED ORDER — CEFTRIAXONE SODIUM 1 G IJ SOLR
500.0000 mg | Freq: Once | INTRAMUSCULAR | Status: AC
  Administered 2016-07-05: 500 mg via INTRAMUSCULAR

## 2016-07-05 MED ORDER — AMOXICILLIN 500 MG PO CAPS: 500.0000 mg | ORAL_CAPSULE | Freq: Three times a day (TID) | ORAL | 0 refills | Status: DC

## 2016-07-05 MED ORDER — ALBUTEROL SULFATE HFA 108 (90 BASE) MCG/ACT IN AERS: 2.0000 | INHALATION_SPRAY | Freq: Four times a day (QID) | RESPIRATORY_TRACT | 2 refills | Status: DC | PRN

## 2016-07-05 MED ORDER — ALPRAZOLAM 0.5 MG PO TABS: 0.5000 mg | ORAL_TABLET | Freq: Four times a day (QID) | ORAL | 0 refills | Status: DC | PRN

## 2016-07-05 MED ORDER — CEFTRIAXONE SODIUM 1 G IJ SOLR: 500.0000 mg | INTRAMUSCULAR | Status: DC

## 2016-07-05 MED ORDER — ONDANSETRON 4 MG PO TBDP
4.0000 mg | ORAL_TABLET | Freq: Three times a day (TID) | ORAL | 0 refills | Status: DC | PRN
Start: 2016-07-05 — End: 2017-05-20

## 2016-07-05 MED ORDER — CLARITHROMYCIN 500 MG PO TABS: 500.0000 mg | ORAL_TABLET | Freq: Two times a day (BID) | ORAL | 0 refills | Status: DC

## 2016-07-05 NOTE — Progress Notes (Signed)
   Subjective:    Patient ID: Jay Garcia, male    DOB: 07/08/1985, 31 y.o.   MRN: [id number] Patient arrives office for fairly extensive evaluation URI   This is a new problem. The current episode started in the past 7 days. The problem has been unchanged. There has been no fever. Associated symptoms include abdominal pain, congestion, coughing and wheezing. Treatments tried: Tamiflu, hycodan, ibuprofen, albuterol. The treatment provided no relief.   Had flu last week, now notes worsening challenges. Productive cough. Positive low-grade fever times. Productive of yellowish phlegm.  Ongoing acheyness and has had ongoing cough and wheezing int he chest  Posrattle in the chest  Dim enrgy and diminished appetite.  Pt' anxiety really bad, with night sweats and anxiety . No suicidal thoughts definitely anxiety and near panic attack type feelings     Review of Systems  HENT: Positive for congestion.   Respiratory: Positive for cough and wheezing.   Gastrointestinal: Positive for abdominal pain.       Objective:   Physical Exam  Alert at times tearful no acute physical distress H&T positive congestion lungs bilateral wheezes no tachypnea left basilar crackles heart rare rhythm ankles at the      Assessment & Plan:  Impression 1 post flu reactive airways discussed at length #2 post flu pneumonia probable community-acquired discussed #3 flare of chronic anxiety treating additional challenges discussed will add Xanax when necessary. Plan antibiotics. Multiple coverage with Rocephin amoxicillin and Biaxin. Care warning signs discussed albuterol 2 sprays 4 times a day easily 25 minutes spent speak with patient about all of his challenges in counseling discussing

## 2016-07-07 ENCOUNTER — Other Ambulatory Visit: Payer: Self-pay | Admitting: Family Medicine

## 2016-08-08 ENCOUNTER — Other Ambulatory Visit: Payer: Self-pay | Admitting: Family Medicine

## 2016-08-09 NOTE — Telephone Encounter (Signed)
Ok three months worth

## 2016-08-09 NOTE — Telephone Encounter (Signed)
Last seen in January for illness

## 2016-10-12 ENCOUNTER — Other Ambulatory Visit: Payer: Self-pay | Admitting: Family Medicine

## 2016-10-12 NOTE — Telephone Encounter (Signed)
Last seen for chronic visit August 2017

## 2016-10-12 NOTE — Telephone Encounter (Signed)
Ok times one, did cover anxiety quite a bit last visit in feb

## 2017-01-19 ENCOUNTER — Other Ambulatory Visit: Payer: Self-pay | Admitting: Family Medicine

## 2017-03-25 ENCOUNTER — Other Ambulatory Visit: Payer: Self-pay | Admitting: Family Medicine

## 2017-03-25 NOTE — Telephone Encounter (Signed)
Last seen 07/05/16 

## 2017-03-26 NOTE — Telephone Encounter (Signed)
1 month rewfill needs med check ov

## 2017-05-06 ENCOUNTER — Other Ambulatory Visit: Payer: Self-pay | Admitting: Family Medicine

## 2017-05-18 ENCOUNTER — Other Ambulatory Visit: Payer: Self-pay

## 2017-05-18 ENCOUNTER — Emergency Department (HOSPITAL_COMMUNITY)
Admission: EM | Admit: 2017-05-18 | Discharge: 2017-05-18 | Disposition: A | Discharge status: Home / Self Care | Payer: 59 | Attending: Emergency Medicine | Admitting: Emergency Medicine

## 2017-05-18 ENCOUNTER — Encounter (HOSPITAL_COMMUNITY): Payer: Self-pay | Admitting: *Deleted

## 2017-05-18 DIAGNOSIS — M65052 Abscess of tendon sheath, left thigh: Secondary | ICD-10-CM | POA: Diagnosis not present

## 2017-05-18 DIAGNOSIS — Z79899 Other long term (current) drug therapy: Secondary | ICD-10-CM | POA: Diagnosis not present

## 2017-05-18 DIAGNOSIS — F17200 Nicotine dependence, unspecified, uncomplicated: Secondary | ICD-10-CM | POA: Insufficient documentation

## 2017-05-18 DIAGNOSIS — R2242 Localized swelling, mass and lump, left lower limb: Secondary | ICD-10-CM | POA: Diagnosis present

## 2017-05-18 DIAGNOSIS — L0291 Cutaneous abscess, unspecified: Secondary | ICD-10-CM

## 2017-05-18 DIAGNOSIS — L02416 Cutaneous abscess of left lower limb: Secondary | ICD-10-CM | POA: Diagnosis not present

## 2017-05-18 MED ORDER — POVIDONE-IODINE 10 % EX SOLN
CUTANEOUS | Status: AC
  Administered 2017-05-18: 1 via TOPICAL
  Filled 2017-05-18: qty 15

## 2017-05-18 MED ORDER — LIDOCAINE HCL (PF) 1 % IJ SOLN
5.0000 mL | Freq: Once | INTRAMUSCULAR | Status: AC
  Administered 2017-05-18: 5 mL
  Filled 2017-05-18: qty 6

## 2017-05-18 MED ORDER — POVIDONE-IODINE 10 % EX SOLN
CUTANEOUS | Status: DC | PRN
  Administered 2017-05-18: 1 via TOPICAL

## 2017-05-18 MED ORDER — KETOROLAC TROMETHAMINE 60 MG/2ML IM SOLN
60.0000 mg | Freq: Once | INTRAMUSCULAR | Status: AC
  Administered 2017-05-18: 60 mg via INTRAMUSCULAR
  Filled 2017-05-18: qty 2

## 2017-05-18 MED ORDER — HYDROCODONE-ACETAMINOPHEN 5-325 MG PO TABS: 1.0000 | ORAL_TABLET | ORAL | 0 refills | Status: DC | PRN

## 2017-05-18 MED ORDER — HYDROCODONE-ACETAMINOPHEN 5-325 MG PO TABS
1.0000 | ORAL_TABLET | Freq: Once | ORAL | Status: AC
  Administered 2017-05-18: 1 via ORAL
  Filled 2017-05-18: qty 1

## 2017-05-18 MED ORDER — DOXYCYCLINE HYCLATE 100 MG PO CAPS: 100.0000 mg | ORAL_CAPSULE | Freq: Two times a day (BID) | ORAL | 0 refills | Status: DC

## 2017-05-18 MED ORDER — DOXYCYCLINE HYCLATE 100 MG PO TABS
100.0000 mg | ORAL_TABLET | Freq: Once | ORAL | Status: AC
  Administered 2017-05-18: 100 mg via ORAL
  Filled 2017-05-18: qty 1

## 2017-05-18 NOTE — Discharge Instructions (Signed)
Keep this infected site covered as long as it continues to drain.  Apply warm water soak as discussed twice daily and make sure to wash the site also twice daily using mild soap and water.  Take the entire course of the antibiotic prescribed.  You may take the hydrocodone prescribed for pain relief.  This will make you drowsy - do not drive within 4 hours of taking this medication.

## 2017-05-18 NOTE — ED Notes (Signed)
ED Provider at bedside. 

## 2017-05-18 NOTE — ED Provider Notes (Signed)
[facility] EMERGENCY DEPARTMENT Provider Note   CSN: [id number] Arrival date & time: 05/18/17  1012     History   Chief Complaint Chief Complaint  Patient presents with  . Insect Bite    HPI Pricilla HolmBryan L Garcia is a 32 y.o. male presenting a painful raised lesion at his left anterior upper thigh which started as a small pimple several days ago.  He squeezed the site, obtained a small amount of purulent discharge and the site has become much larger and more painful.  He denies fevers, chills, nausea, vomiting.  Has been unable to work due to pain wearing pants given the location of the wound. Denies h/o abscess or mrsa. He has taken ibuprofen without relief of sx.   The history is provided by the patient.    Past Medical History:  Diagnosis Date  . Panic disorder   . Reactive airway disease   . Reflux     Patient Active Problem List   Diagnosis Date Noted  . Anxiety 12/16/2015  . Gastroesophageal reflux disease without esophagitis 05/14/2015  . Reactive airway disease 11/13/2012  . ADHD (attention deficit hyperactivity disorder) 10/16/2012  . Panic disorder 10/16/2012    History reviewed. No pertinent surgical history.     Home Medications    Prior to Admission medications   Medication Sig Start Date End Date Taking? Authorizing Provider  albuterol (PROVENTIL HFA;VENTOLIN HFA) 108 (90 Base) MCG/ACT inhaler Inhale 2 puffs into the lungs every 6 (six) hours as needed for wheezing or shortness of breath. 07/05/16   Merlyn AlbertLuking, William S, MD  ALPRAZolam Prudy Feeler(XANAX) 0.5 MG tablet Take 1 tablet (0.5 mg total) by mouth every 6 (six) hours as needed for anxiety. 07/05/16   Merlyn AlbertLuking, William S, MD  amoxicillin (AMOXIL) 500 MG capsule Take 1 capsule (500 mg total) by mouth 3 (three) times daily. 07/05/16   Merlyn AlbertLuking, William S, MD  clarithromycin (BIAXIN) 500 MG tablet Take 1 tablet (500 mg total) by mouth 2 (two) times daily. 07/05/16   Merlyn AlbertLuking, William S, MD  doxycycline (VIBRAMYCIN) 100 MG  capsule Take 1 capsule (100 mg total) by mouth 2 (two) times daily. 05/18/17   Burgess AmorIdol, Zethan Alfieri, PA-C  esomeprazole (NEXIUM) 40 MG capsule One po qd prn acid reflux 01/03/16   Campbell RichesHoskins, Carolyn C, NP  HYDROcodone-acetaminophen (NORCO/VICODIN) 5-325 MG tablet Take 1 tablet by mouth every 4 (four) hours as needed. 05/18/17   Burgess AmorIdol, Cristan Hout, PA-C  HYDROcodone-homatropine (HYCODAN) 5-1.5 MG/5ML syrup Take one tsp qhs prn cough Patient not taking: Reported on 07/05/2016 06/29/16   Merlyn AlbertLuking, William S, MD  ondansetron (ZOFRAN ODT) 4 MG disintegrating tablet Take 1 tablet (4 mg total) by mouth every 8 (eight) hours as needed for nausea or vomiting. 07/05/16   Merlyn AlbertLuking, William S, MD  oseltamivir (TAMIFLU) 75 MG capsule Take 1 capsule (75 mg total) by mouth 2 (two) times daily. Patient not taking: Reported on 07/05/2016 06/29/16   Merlyn AlbertLuking, William S, MD  PARoxetine (PAXIL) 20 MG tablet TAKE 1 TABLET BY MOUTH EVERY DAY 05/06/17   Merlyn AlbertLuking, William S, MD    Family History Family History  Problem Relation Age of Onset  . Hypertension Mother     Social History Social History   Tobacco Use  . Smoking status: Current Some Day Smoker  . Smokeless tobacco: Never Used  Substance Use Topics  . Alcohol use: No  . Drug use: No     Allergies   Benadryl [diphenhydramine hcl]   Review of Systems Review of  Systems  Constitutional: Negative for chills and fever.  Respiratory: Negative for shortness of breath and wheezing.   Skin: Positive for color change and wound.  Neurological: Negative for numbness.     Physical Exam Updated Vital Signs BP (!) 139/106   Pulse 71   Temp 98.1 F (36.7 C) (Oral)   Resp 18   Ht 5\' 10"  (1.778 m)   Wt 83.5 kg (184 lb)   SpO2 100%   BMI 26.40 kg/m   Physical Exam  Constitutional: He is oriented to person, place, and time. He appears well-developed and well-nourished.  HENT:  Head: Normocephalic.  Cardiovascular: Normal rate.  Pulmonary/Chest: Effort normal.  Musculoskeletal:  He exhibits no tenderness.  Neurological: He is alert and oriented to person, place, and time. No sensory deficit.  Skin: There is erythema.  Raised and indurated lesion left anterior upper thigh.  The site is 1.5 cm of central induration without drainage.  There is a 3 cm surrounding area of erythema.     ED Treatments / Results  Labs (all labs ordered are listed, but only abnormal results are displayed) Labs Reviewed - No data to display  EKG  EKG Interpretation None       Radiology No results found.  Procedures Procedures (including critical care time)  INCISION AND DRAINAGE Performed by: Burgess Amor Consent: Verbal consent obtained. Risks and benefits: risks, benefits and alternatives were discussed Type: abscess  Body area: left upper thigh  Anesthesia: local infiltration  Incision was made with a scalpel.  Local anesthetic: lidocaine 1% without epinephrine  Anesthetic total: 5 ml  Complexity: complex Blunt dissection to break up loculations  Drainage: purulent  Drainage amount: small  Packing material: none Patient tolerance: Patient tolerated the procedure well with no immediate complications.     Medications Ordered in ED Medications  povidone-iodine (BETADINE) 10 % external solution (1 application Topical Given by Other 05/18/17 1300)  doxycycline (VIBRA-TABS) tablet 100 mg (not administered)  ketorolac (TORADOL) injection 60 mg (60 mg Intramuscular Given 05/18/17 1214)  HYDROcodone-acetaminophen (NORCO/VICODIN) 5-325 MG per tablet 1 tablet (1 tablet Oral Given 05/18/17 1215)  lidocaine (PF) (XYLOCAINE) 1 % injection 5 mL (5 mLs Other Given by Other 05/18/17 1214)     Initial Impression / Assessment and Plan / ED Course  I have reviewed the triage vital signs and the nursing notes.  Pertinent labs & imaging results that were available during my care of the patient were reviewed by me and considered in my medical decision making (see chart for  details).     Patient was placed on doxycycline with first dose given here.  Discussed home care including warm water soaks.  Plan follow-up with PCP in 1 week if not improving, sooner for any worsening symptoms.  Final Clinical Impressions(s) / ED Diagnoses   Final diagnoses:  Abscess    ED Discharge Orders        Ordered    doxycycline (VIBRAMYCIN) 100 MG capsule  2 times daily     05/18/17 1334    HYDROcodone-acetaminophen (NORCO/VICODIN) 5-325 MG tablet  Every 4 hours PRN     05/18/17 1334       Burgess Amor, PA-C 05/18/17 1340    Doug Sou, MD 05/18/17 1547

## 2017-05-18 NOTE — ED Triage Notes (Signed)
Pt c/o insect bite to left lower abdomen x 2 days. The area is warm, red, streaking, draining. Denies fever.

## 2017-05-20 ENCOUNTER — Ambulatory Visit: Payer: 59 | Admitting: Family Medicine

## 2017-05-20 ENCOUNTER — Encounter: Payer: Self-pay | Admitting: Family Medicine

## 2017-05-20 VITALS — BP 132/84 | Temp 98.8°F | Ht 70.0 in | Wt 197.0 lb

## 2017-05-20 DIAGNOSIS — L0291 Cutaneous abscess, unspecified: Secondary | ICD-10-CM

## 2017-05-20 DIAGNOSIS — L03818 Cellulitis of other sites: Secondary | ICD-10-CM | POA: Diagnosis not present

## 2017-05-20 MED ORDER — CEPHALEXIN 500 MG PO CAPS: 500.0000 mg | ORAL_CAPSULE | Freq: Four times a day (QID) | ORAL | 0 refills | Status: DC

## 2017-05-20 MED ORDER — CLINDAMYCIN HCL 300 MG PO CAPS: 300.0000 mg | ORAL_CAPSULE | Freq: Three times a day (TID) | ORAL | 0 refills | Status: DC

## 2017-05-20 MED ORDER — MUPIROCIN 2 % EX OINT: TOPICAL_OINTMENT | CUTANEOUS | 0 refills | Status: DC

## 2017-05-20 MED ORDER — OXYCODONE-ACETAMINOPHEN 5-325 MG PO TABS: 1.0000 | ORAL_TABLET | ORAL | 0 refills | Status: DC | PRN

## 2017-05-20 NOTE — Progress Notes (Signed)
20 minutes spent with patient He presents with an abscess that has been drained in the ER on the left upper thigh region He states is still draining some He is not tolerating doxycycline because of nausea vomiting He is requesting we look at this he wonders if it needs further draining He has never had this before Started off as a small spot that was called a spider bite but developed an abscess Denies high fever chills sweats denies wheezing difficulty breathing PMH benign On examination abdomen is soft skin warm dry has drained the abscess with a lot of eschar in the center and destructive tissue the area of infection is approximately 1 inch in diameter area that is open is approximately 2 cm in length half centimeter wide and half centimeter deep it does have eschar as well as abscess material.  After discussion with the patient the patient agrees to have further procedure done on this.  1% lidocaine without epinephrine was injected around the site without difficulty.  The area was cleansed with Betadine and then the exudate and related material was removed with gauze pads as well as forceps in order to help clear it down to viable flash  The patient was counseled that this area needs to be cleansed on a daily basis gently scrubbed and Bactroban ointment placed in it Doxycycline was stopped He was put on dual antibiotic therapy Clindamycin 3 times daily for the next 10 days Keflex 500 4 times daily for the next 10 days Percocet for severe pain cautioned drowsiness only for home use not for long-term use Work excuse for the next several days follow-up next week for recheck Warm compresses frequently over the course of the next several days I did not feel this patient needs a lab work or culture.  I did not feel culture would show anything since he had been previously on doxycycline the patient was instructed that if he starts having high fevers or if this gets dramatically worse to go back to  the ER

## 2017-05-25 ENCOUNTER — Ambulatory Visit (INDEPENDENT_AMBULATORY_CARE_PROVIDER_SITE_OTHER): Payer: 59 | Admitting: Family Medicine

## 2017-05-25 ENCOUNTER — Encounter: Payer: Self-pay | Admitting: Family Medicine

## 2017-05-25 VITALS — BP 106/72 | Ht 70.0 in | Wt 199.6 lb

## 2017-05-25 DIAGNOSIS — L03818 Cellulitis of other sites: Secondary | ICD-10-CM

## 2017-05-25 MED ORDER — OXYCODONE-ACETAMINOPHEN 5-325 MG PO TABS: 1.0000 | ORAL_TABLET | ORAL | 0 refills | Status: DC | PRN

## 2017-05-25 NOTE — Progress Notes (Signed)
   Subjective:    Patient ID: Haynes DageBryan L Musolf, male    DOB: 12/23/1985, 32 y.o.   MRN: [id number]  HPI  Patient arrives for a follow up on abscess on the left side of abdomen.  Patient arrives office for a protracted discussion regarding his worries about his abscess.  Complete hospital record reviewed.  Compliant with antibiotics.  No fever or chills.  Has a chronic open sore at the site of abscess.  Changing dressings twice a day.   Review of Systems No headache, no major weight loss or weight gain, no chest pain no back pain abdominal pain no change in bowel habits complete ROS otherwise negative     Objective:   Physical Exam  Alert vitals stable, NAD. Blood pressure good on repeat. HEENT normal. Lungs clear. Heart regular rate and rhythm. Left lower abdomen near inguinal region healing ulceration with induration.  No true fluctuance.  Slight tenderness to digital      Assessment & Plan:  Impression persistent cellulitis slowly improving abscess numerous questions answered  Greater than 50% of this 25 minute face to face visit was spent in counseling and discussion and coordination of care regarding the above diagnosis/diagnosies

## 2017-06-11 ENCOUNTER — Other Ambulatory Visit: Payer: Self-pay | Admitting: Family Medicine

## 2017-06-21 ENCOUNTER — Encounter: Payer: Self-pay | Admitting: Family Medicine

## 2017-06-21 ENCOUNTER — Ambulatory Visit (INDEPENDENT_AMBULATORY_CARE_PROVIDER_SITE_OTHER): Payer: 59 | Admitting: Family Medicine

## 2017-06-21 VITALS — BP 122/86 | Temp 98.3°F | Ht 70.0 in | Wt 197.4 lb

## 2017-06-21 DIAGNOSIS — B9689 Other specified bacterial agents as the cause of diseases classified elsewhere: Secondary | ICD-10-CM | POA: Diagnosis not present

## 2017-06-21 DIAGNOSIS — J019 Acute sinusitis, unspecified: Secondary | ICD-10-CM

## 2017-06-21 DIAGNOSIS — J111 Influenza due to unidentified influenza virus with other respiratory manifestations: Secondary | ICD-10-CM

## 2017-06-21 MED ORDER — AZITHROMYCIN 250 MG PO TABS: ORAL_TABLET | ORAL | 0 refills | Status: DC

## 2017-06-21 MED ORDER — ALBUTEROL SULFATE HFA 108 (90 BASE) MCG/ACT IN AERS: 2.0000 | INHALATION_SPRAY | RESPIRATORY_TRACT | 2 refills | Status: DC | PRN

## 2017-06-21 NOTE — Progress Notes (Signed)
   Subjective:    Patient ID: Jay Garcia, male    DOB: 12/29/1985, 32 y.o.   MRN: [id number]  Influenza  This is a new problem. The current episode started in the past 7 days. Associated symptoms include a change in bowel habit, congestion, coughing, fatigue and a sore throat. Pertinent negatives include no arthralgias, chest pain, chills, fever, nausea or vomiting. Associated symptoms comments: Wheezing, runny nose. Nothing aggravates the symptoms. Treatments tried: mucinex dm.   Patient with increased congestion coughing denies wheezing nausea vomiting diarrhea  Patient not toxic Review of Systems  Constitutional: Positive for fatigue. Negative for activity change, chills and fever.  HENT: Positive for congestion, rhinorrhea and sore throat. Negative for ear pain.   Eyes: Negative for discharge.  Respiratory: Positive for cough. Negative for wheezing.   Cardiovascular: Negative for chest pain.  Gastrointestinal: Positive for change in bowel habit. Negative for nausea and vomiting.  Musculoskeletal: Negative for arthralgias.       Objective:   Physical Exam  Constitutional: He appears well-developed.  HENT:  Head: Normocephalic and atraumatic.  Mouth/Throat: Oropharynx is clear and moist. No oropharyngeal exudate.  Eyes: Right eye exhibits no discharge. Left eye exhibits no discharge.  Neck: Normal range of motion.  Cardiovascular: Normal rate, regular rhythm and normal heart sounds.  No murmur heard. Pulmonary/Chest: Effort normal and breath sounds normal. No respiratory distress. He has no wheezes. He has no rales.  Lymphadenopathy:    He has no cervical adenopathy.  Neurological: He exhibits normal muscle tone.  Skin: Skin is warm and dry.  Nursing note and vitals reviewed.          Assessment & Plan:  Influenza-the patient was diagnosed with influenza. Patient/family educated about the flu and warning signs to watch for. If difficulty breathing, severe neck pain  and stiffness, cyanosis, disorientation, or progressive worsening then immediately get rechecked at that ER. If progressive symptoms be certain to be rechecked. Supportive measures such as Tylenol/ibuprofen was discussed. No aspirin use in children. And influenza home care instruction sheet was given.  Patient with secondary sinusitis and bronchitis from the flu

## 2017-06-21 NOTE — Patient Instructions (Signed)

## 2017-07-30 ENCOUNTER — Other Ambulatory Visit: Payer: Self-pay | Admitting: Family Medicine

## 2017-08-01 ENCOUNTER — Other Ambulatory Visit: Payer: Self-pay | Admitting: Family Medicine

## 2017-08-01 NOTE — Telephone Encounter (Signed)
One mo chronic o v soon

## 2017-08-18 ENCOUNTER — Encounter: Payer: Self-pay | Admitting: Family Medicine

## 2017-08-18 ENCOUNTER — Ambulatory Visit (INDEPENDENT_AMBULATORY_CARE_PROVIDER_SITE_OTHER): Payer: 59 | Admitting: Family Medicine

## 2017-08-18 VITALS — BP 122/80 | Temp 98.6°F | Ht 70.0 in | Wt 196.1 lb

## 2017-08-18 DIAGNOSIS — N410 Acute prostatitis: Secondary | ICD-10-CM

## 2017-08-18 DIAGNOSIS — R300 Dysuria: Secondary | ICD-10-CM

## 2017-08-18 LAB — POCT URINALYSIS DIPSTICK
Blood, UA: NEGATIVE
LEUKOCYTES UA: NEGATIVE
PH UA: 6 (ref 5.0–8.0)

## 2017-08-18 MED ORDER — PHENAZOPYRIDINE HCL 200 MG PO TABS: 200.0000 mg | ORAL_TABLET | Freq: Three times a day (TID) | ORAL | 1 refills | Status: DC | PRN

## 2017-08-18 MED ORDER — DOXYCYCLINE HYCLATE 100 MG PO CAPS: 100.0000 mg | ORAL_CAPSULE | Freq: Two times a day (BID) | ORAL | 0 refills | Status: DC

## 2017-08-18 NOTE — Progress Notes (Signed)
   Subjective:    Patient ID: Jay Garcia, male    DOB: 03/30/1986, 32 y.o.   MRN: [id number]  HPI Patient is here today with complaints of painful urination and abd pain, feels like he is unable to empty bladder, on going for the last three days. He has not taken anything for the discomfort.States he has a burning feeling in his bladder, some lower back pain.  Significant discomfort lower pelvic frequency hurts when he pees denies drainage denies discharge denies hematuria denies rectal bleeding Results for orders placed or performed in visit on 08/18/17  POCT urinalysis dipstick  Result Value Ref Range   Color, UA     Clarity, UA     Glucose, UA     Bilirubin, UA     Ketones, UA     Spec Grav, UA <=1.005 (A) 1.010 - 1.025   Blood, UA negative    pH, UA 6.0 5.0 - 8.0   Protein, UA     Urobilinogen, UA  0.2 or 1.0 E.U./dL   Nitrite, UA     Leukocytes, UA Negative Negative   Appearance     Odor      Review of Systems  Constitutional: Negative for activity change, fatigue and fever.  HENT: Negative for congestion and rhinorrhea.   Respiratory: Negative for cough and shortness of breath.   Cardiovascular: Negative for chest pain and leg swelling.  Gastrointestinal: Negative for abdominal pain, diarrhea and nausea.  Genitourinary: Positive for difficulty urinating, dysuria and frequency. Negative for discharge, flank pain, hematuria and penile pain.  Neurological: Negative for weakness and headaches.  Psychiatric/Behavioral: Negative for behavioral problems.       Objective:   Physical Exam  Constitutional: He appears well-nourished. No distress.  HENT:  Head: Normocephalic and atraumatic.  Eyes: Right eye exhibits no discharge. Left eye exhibits no discharge.  Neck: No tracheal deviation present.  Cardiovascular: Normal rate, regular rhythm and normal heart sounds.  No murmur heard. Pulmonary/Chest: Effort normal and breath sounds normal. No respiratory distress. He has  no wheezes.  Abdominal: Soft. He exhibits no distension. There is no tenderness. There is no rebound.  Genitourinary: Penis normal. No penile tenderness.  Genitourinary Comments: Prostate exam very tender  Musculoskeletal: He exhibits no edema.  Lymphadenopathy:    He has no cervical adenopathy.  Neurological: He is alert.  Skin: Skin is warm. No rash noted.  Psychiatric: His behavior is normal.  Vitals reviewed.         Assessment & Plan:  Prostatitis Supportive measures Pyridium Antibiotics Warning signs discussed Follow-up if progressive troubles

## 2017-08-18 NOTE — Patient Instructions (Signed)
Prostatitis Prostatitis is swelling or inflammation of the prostate gland. The prostate is a walnut-sized gland that is involved in the production of semen. It is located below a man's bladder, in front of the rectum. There are four types of prostatitis:  Chronic nonbacterial prostatitis. This is the most common type of prostatitis. It may be associated with a viral infection or autoimmune disorder.  Acute bacterial prostatitis. This is the least common type of prostatitis. It starts quickly and is usually associated with a bladder infection, high fever, and shaking chills. It can occur at any age.  Chronic bacterial prostatitis. This type usually results from acute bacterial prostatitis that happens repeatedly (is recurrent) or has not been treated properly. It can occur in men of any age but is most common among middle-aged men whose prostate has begun to get larger. The symptoms are not as severe as symptoms caused by acute bacterial prostatitis.  Prostatodynia or chronic pelvic pain syndrome (CPPS). This type is also called pelvic floor disorder. It is associated with increased muscular tone in the pelvis surrounding the prostate. What are the causes? Bacterial prostatitis is caused by infection from bacteria. Chronic nonbacterial prostatitis may be caused by:  Urinary tract infections (UTIs).  Nerve damage.  A response by the body's disease-fighting system (autoimmune response).  Chemicals in the urine. The causes of the other types of prostatitis are usually not known. What are the signs or symptoms? Symptoms of this condition vary depending upon the type of prostatitis. If you have acute bacterial prostatitis, you may experience:  Urinary symptoms, such as:  Painful urination.  Burning during urination.  Frequent and sudden urges to urinate.  Inability to start urinating.  A weak or interrupted stream of urine.  Vomiting.  Nausea.  Fever.  Chills.  Inability to  empty the bladder completely.  Pain in the:  Muscles or joints.  Lower back.  Lower abdomen. If you have any of the other types of prostatitis, you may experience:  Urinary symptoms, such as:  Sudden urges to urinate.  Frequent urination.  Difficulty starting urination.  Weak urine stream.  Dribbling after urination.  Discharge from the urethra. The urethra is a tube that opens at the end of the penis.  Pain in the:  Testicles.  Penis or tip of the penis.  Rectum.  Area in front of the rectum and below the scrotum (perineum).  Problems with sexual function.  Painful ejaculation.  Bloody semen. How is this diagnosed? This condition may be diagnosed based on:  A physical and medical exam.  Your symptoms.  A urine test to check for bacteria.  An exam in which a health care provider uses a finger to feel the prostate (digital rectal exam).  A test of a sample of semen.  Blood tests.  Ultrasound.  Removal of prostate tissue to be examined under a microscope (biopsy).  Tests to check how your body handles urine (urodynamic tests).  A test to look inside your bladder or urethra (cystoscopy). How is this treated? Treatment for this condition depends on the type of prostatitis. Treatment may involve:  Medicines to relieve pain or inflammation.  Medicines to help relax your muscles.  Physical therapy.  Heat therapy.  Techniques to help you control certain body functions (biofeedback).  Relaxation exercises.  Antibiotic medicine, if your condition is caused by bacteria.  Warm water baths (sitz baths). Sitz baths help with relaxing your pelvic floor muscles, which helps to relieve pressure on the prostate. Follow   these instructions at home:  Take over-the-counter and prescription medicines only as told by your health care provider.  If you were prescribed an antibiotic, take it as told by your health care provider. Do not stop taking the  antibiotic even if you start to feel better.  If physical therapy, biofeedback, or relaxation exercises were prescribed, do exercises as instructed.  Take sitz baths as directed by your health care provider. For a sitz bath, sit in warm water that is deep enough to cover your hips and buttocks.  Keep all follow-up visits as told by your health care provider. This is important. Contact a health care provider if:  Your symptoms get worse.  You have a fever. Get help right away if:  You have chills.  You feel nauseous.  You vomit.  You feel light-headed or feel like you are going to faint.  You are unable to urinate.  You have blood or blood clots in your urine. This information is not intended to replace advice given to you by your health care provider. Make sure you discuss any questions you have with your health care provider. Document Released: 04/30/2000 Document Revised: 01/22/2016 Document Reviewed: 01/22/2016 Elsevier Interactive Patient Education  2017 [employer]  

## 2017-08-23 ENCOUNTER — Telehealth: Payer: Self-pay | Admitting: Family Medicine

## 2017-08-23 NOTE — Telephone Encounter (Signed)
In the clinical situation where the prostate is tender to palpation it is not indicated to do urine cultures or other bacterial cultures instead what is indicated is to go ahead with antibiotics for 3 weeks.  If the patient was having dysuria but not having prostate tenderness then we would in fact do additional urine testing.  When I did do his urine dipstick it did not show any evidence of a UTI but his dysuria was related to the prostatitis as is evidence of tenderness of the prostate with palpation.  So therefore he should get well with antibiotics alone.  The patient should see gradual resolution over the course of the first 10-14 days but it is necessary to take it for 3 weeks.  If the patient is having any problems please let us know.

## 2017-08-23 NOTE — Telephone Encounter (Signed)
Pt contacted and was informed to continue abts as directed and if problems persist, call for follow up visit

## 2017-08-23 NOTE — Telephone Encounter (Signed)
Pt is wanting to know the results to recent lab orders. Results are waiting on Dr. To comment on them. Please advise.

## 2017-08-23 NOTE — Telephone Encounter (Signed)
Pt called to check on results from urine. The urine dipstick is the only urine test in chart. Do not see an order for urine culture. Please advise. Seen 08/18/2017

## 2017-08-25 ENCOUNTER — Encounter: Payer: Self-pay | Admitting: Family Medicine

## 2017-09-05 ENCOUNTER — Telehealth: Payer: Self-pay

## 2017-09-05 ENCOUNTER — Other Ambulatory Visit: Payer: Self-pay | Admitting: *Deleted

## 2017-09-05 ENCOUNTER — Encounter: Payer: Self-pay | Admitting: Family Medicine

## 2017-09-05 MED ORDER — CIPROFLOXACIN HCL 500 MG PO TABS: 500.0000 mg | ORAL_TABLET | Freq: Two times a day (BID) | ORAL | 0 refills | Status: DC

## 2017-09-05 NOTE — Telephone Encounter (Signed)
Stop doxy, cipro 500 bid 21 d

## 2017-09-05 NOTE — Telephone Encounter (Signed)
Patient was seen on 08/18/2017 for infected prostate. States he is still having pain when he strains or coughs. He states he is not having any fevers. He is still taking Doxycycline.Spoke with Dr. Lubertha SouthSteve Luking and he suggests we may need to switch the antibx's.Patient is aware.(Note sent to Dr.Steve Luking for advise on [facility] [address]

## 2017-09-05 NOTE — Telephone Encounter (Signed)
Left message to return call 

## 2017-09-05 NOTE — Telephone Encounter (Signed)
Pt notified and med sent to pharm.  

## 2017-09-06 ENCOUNTER — Emergency Department (HOSPITAL_COMMUNITY): Payer: 59

## 2017-09-06 ENCOUNTER — Emergency Department (HOSPITAL_COMMUNITY)
Admission: EM | Admit: 2017-09-06 | Discharge: 2017-09-06 | Disposition: A | Discharge status: Home / Self Care | Payer: 59 | Attending: Emergency Medicine | Admitting: Emergency Medicine

## 2017-09-06 ENCOUNTER — Other Ambulatory Visit: Payer: Self-pay

## 2017-09-06 ENCOUNTER — Telehealth: Payer: Self-pay

## 2017-09-06 ENCOUNTER — Encounter (HOSPITAL_COMMUNITY): Payer: Self-pay | Admitting: Emergency Medicine

## 2017-09-06 DIAGNOSIS — F17200 Nicotine dependence, unspecified, uncomplicated: Secondary | ICD-10-CM | POA: Insufficient documentation

## 2017-09-06 DIAGNOSIS — K6289 Other specified diseases of anus and rectum: Secondary | ICD-10-CM

## 2017-09-06 DIAGNOSIS — Z79899 Other long term (current) drug therapy: Secondary | ICD-10-CM | POA: Diagnosis not present

## 2017-09-06 DIAGNOSIS — R600 Localized edema: Secondary | ICD-10-CM | POA: Diagnosis not present

## 2017-09-06 DIAGNOSIS — R300 Dysuria: Secondary | ICD-10-CM | POA: Diagnosis not present

## 2017-09-06 DIAGNOSIS — K5120 Ulcerative (chronic) proctitis without complications: Secondary | ICD-10-CM | POA: Diagnosis not present

## 2017-09-06 LAB — URINALYSIS, ROUTINE W REFLEX MICROSCOPIC
BILIRUBIN URINE: NEGATIVE
Glucose, UA: NEGATIVE mg/dL
HGB URINE DIPSTICK: NEGATIVE
Ketones, ur: NEGATIVE mg/dL
Leukocytes, UA: NEGATIVE
Nitrite: NEGATIVE
PROTEIN: NEGATIVE mg/dL
Specific Gravity, Urine: 1.004 — ABNORMAL LOW (ref 1.005–1.030)
pH: 7 (ref 5.0–8.0)

## 2017-09-06 LAB — COMPREHENSIVE METABOLIC PANEL
ALBUMIN: 4 g/dL (ref 3.5–5.0)
ALK PHOS: 68 U/L (ref 38–126)
ALT: 17 U/L (ref 17–63)
AST: 19 U/L (ref 15–41)
Anion gap: 10 (ref 5–15)
BUN: 13 mg/dL (ref 6–20)
CALCIUM: 8.5 mg/dL — AB (ref 8.9–10.3)
CO2: 27 mmol/L (ref 22–32)
CREATININE: 1.19 mg/dL (ref 0.61–1.24)
Chloride: 100 mmol/L — ABNORMAL LOW (ref 101–111)
GFR calc non Af Amer: >60 mL/min (ref >60)
GLUCOSE: 120 mg/dL — AB (ref 65–99)
Potassium: 3.7 mmol/L (ref 3.5–5.1)
Sodium: 137 mmol/L (ref 135–145)
Total Bilirubin: 0.7 mg/dL (ref 0.3–1.2)
Total Protein: 6.7 g/dL (ref 6.5–8.1)

## 2017-09-06 LAB — CBC
HCT: 46.3 % (ref 39.0–52.0)
Hemoglobin: 15.9 g/dL (ref 13.0–17.0)
MCH: 31.7 pg (ref 26.0–34.0)
MCHC: 34.3 g/dL (ref 30.0–36.0)
MCV: 92.2 fL (ref 78.0–100.0)
PLATELETS: 249 10*3/uL (ref 150–400)
RBC: 5.02 MIL/uL (ref 4.22–5.81)
RDW: 12.6 % (ref 11.5–15.5)
WBC: 8.6 10*3/uL (ref 4.0–10.5)

## 2017-09-06 LAB — LIPASE, BLOOD: Lipase: 26 U/L (ref 11–51)

## 2017-09-06 MED ORDER — HYDROCODONE-ACETAMINOPHEN 5-325 MG PO TABS: 1.0000 | ORAL_TABLET | Freq: Four times a day (QID) | ORAL | 0 refills | Status: DC | PRN

## 2017-09-06 MED ORDER — IOPAMIDOL (ISOVUE-300) INJECTION 61%
100.0000 mL | Freq: Once | INTRAVENOUS | Status: AC | PRN
  Administered 2017-09-06: 100 mL via INTRAVENOUS

## 2017-09-06 MED ORDER — METRONIDAZOLE 500 MG PO TABS
500.0000 mg | ORAL_TABLET | Freq: Once | ORAL | Status: AC
Start: 2017-09-06 — End: 2017-09-06
  Administered 2017-09-06: 500 mg via ORAL
  Filled 2017-09-06: qty 1

## 2017-09-06 MED ORDER — METRONIDAZOLE 500 MG PO TABS: 500.0000 mg | ORAL_TABLET | Freq: Two times a day (BID) | ORAL | 0 refills | Status: DC

## 2017-09-06 MED ORDER — CIPROFLOXACIN HCL 250 MG PO TABS
500.0000 mg | ORAL_TABLET | Freq: Once | ORAL | Status: AC
  Administered 2017-09-06: 500 mg via ORAL
  Filled 2017-09-06: qty 2

## 2017-09-06 NOTE — ED Triage Notes (Signed)
Pt was seen at PCP for "prostate infection"  And was put on ABX but states pain is increasing.  C/o of lower abdominal pain.

## 2017-09-06 NOTE — ED Notes (Signed)
E-signature pad would not receive signature. Pt verbalized understanding of discharge instructions. nad noted.

## 2017-09-06 NOTE — Discharge Instructions (Addendum)
Take Advil or Motrin for mild pain or the pain medicine prescribed for bad pain.  Call Dr. Jena Gaussourk tomorrow to schedule next available appointment.  Tell office staff that you were seen in the emergency department.  Take the antibiotic (cipro) prescribed by Dr.Luking tomorrow along with the antibiotic (Flagyl) prescribed here.  Return for vomiting, fever or if you feel worse for any reason.

## 2017-09-06 NOTE — Telephone Encounter (Signed)
ok 

## 2017-09-06 NOTE — Telephone Encounter (Signed)
Patient was seen for a prostate infection on 08/18/2017,called yesterday and new antibx sent in for him. Pt calls back today states he is in extreme pain hurts to walk,cough,etc.Spoke with Dr. Lubertha SouthSteve Luking I advised we had no availability today and he advised since "extreme" pain go to the Ed. Pt states understanding.

## 2017-09-06 NOTE — ED Provider Notes (Signed)
[facility] EMERGENCY DEPARTMENT Provider Note   CSN: [id number] Arrival date & time: 09/06/17  1450     History   Chief Complaint Chief Complaint  Patient presents with  . Abdominal Pain    HPI Jay Garcia is a 32 y.o. male.  HPI Patient reports he has  painful urination and pain at suprapubic area for the past 2 weeks.  He was seen by his primary care physician Dr.Luking approximately 2 weeks ago and treated with doxycycline for prostate infection.  He has been on doxycycline for 2 weeks and he reports no relief in pain.  Pain also exacerbated by having bowel movements.  He is been treating himself with ibuprofen with minimal relief.  No nausea no vomiting no fever.  No urethral discharge.  No other associated symptoms.  He reports that his antibiotic was changed yesterday however he has not been to the pharmacy to pick up the new prescription yet.  Medical records reviewed.  Patient prescribed Cipro yesterday. symptoms are worse with movement or coughing and improved with remaining still.  No other associated symptoms Past Medical History:  Diagnosis Date  . Panic disorder   . Reactive airway disease   . Reflux     Patient Active Problem List   Diagnosis Date Noted  . Anxiety 12/16/2015  . Gastroesophageal reflux disease without esophagitis 05/14/2015  . Reactive airway disease 11/13/2012  . ADHD (attention deficit hyperactivity disorder) 10/16/2012  . Panic disorder 10/16/2012    History reviewed. No pertinent surgical history.      Home Medications    Prior to Admission medications   Medication Sig Start Date End Date Taking? Authorizing Provider  albuterol (PROVENTIL HFA;VENTOLIN HFA) 108 (90 Base) MCG/ACT inhaler Inhale 2 puffs into the lungs every 4 (four) hours as needed for wheezing. 06/21/17   Babs Sciara, MD  azithromycin (ZITHROMAX Z-PAK) 250 MG tablet Take 2 tablets (500 mg) on  Day 1,  followed by 1 tablet (250 mg) once daily on Days 2 through 5.  06/21/17   Luking, Jonna Coup, MD  ciprofloxacin (CIPRO) 500 MG tablet Take 1 tablet (500 mg total) by mouth 2 (two) times daily. 09/05/17   Merlyn Albert, MD  doxycycline (VIBRAMYCIN) 100 MG capsule Take 1 capsule (100 mg total) by mouth 2 (two) times daily. 08/18/17   Babs Sciara, MD  mupirocin ointment (BACTROBAN) 2 % Apply to affected area once per day 05/20/17 05/20/18  Babs Sciara, MD  oxyCODONE-acetaminophen (ROXICET) 5-325 MG tablet Take 1 tablet by mouth every 4 (four) hours as needed for severe pain. 05/25/17   Merlyn Albert, MD  PARoxetine (PAXIL) 20 MG tablet TAKE 1 TABLET BY MOUTH EVERY DAY 08/01/17   Merlyn Albert, MD  phenazopyridine (PYRIDIUM) 200 MG tablet Take 1 tablet (200 mg total) by mouth 3 (three) times daily as needed for pain. 08/18/17   Babs Sciara, MD    Family History Family History  Problem Relation Age of Onset  . Hypertension Mother     Social History Social History   Tobacco Use  . Smoking status: Current Some Day Smoker  . Smokeless tobacco: Never Used  Substance Use Topics  . Alcohol use: No  . Drug use: No     Allergies   Benadryl [diphenhydramine hcl]   Review of Systems Review of Systems  Gastrointestinal: Positive for abdominal pain and rectal pain.       Suprapubic pain.  Painful bowel movements  Genitourinary:  Positive for dysuria.       Painful urination.  All other systems reviewed and are negative.    Physical Exam Updated Vital Signs BP 137/90 (BP Location: Right Arm)   Pulse 74   Temp 98.2 F (36.8 C) (Oral)   Resp 18   Ht 5\' 10"  (1.778 m)   Wt 88.9 kg (196 lb)   SpO2 100%   BMI 28.12 kg/m   Physical Exam  Constitutional: He is oriented to person, place, and time. He appears well-developed and well-nourished. No distress.  HENT:  Head: Normocephalic and atraumatic.  Eyes: Pupils are equal, round, and reactive to light. Conjunctivae are normal.  Neck: Neck supple. No tracheal deviation present. No  thyromegaly present.  Cardiovascular: Normal rate and regular rhythm.  No murmur heard. Pulmonary/Chest: Effort normal and breath sounds normal.  Abdominal: Soft. Bowel sounds are normal. He exhibits no distension. There is tenderness.  Tender at suprapubic area  Genitourinary: Rectum normal and penis normal.  Genitourinary Comments: Tender on prostate exam.  Scrotum normal. prostate is not boggy  Musculoskeletal: Normal range of motion. He exhibits no edema or tenderness.  Neurological: He is alert and oriented to person, place, and time. Coordination normal.  Skin: Skin is warm and dry. No rash noted.  Psychiatric: He has a normal mood and affect.  Nursing note and vitals reviewed.    ED Treatments / Results  Labs (all labs ordered are listed, but only abnormal results are displayed) Labs Reviewed  LIPASE, BLOOD  COMPREHENSIVE METABOLIC PANEL  CBC  URINALYSIS, ROUTINE W REFLEX MICROSCOPIC    EKG None  Radiology No results found.  Procedures Procedures (including critical care time)  Medications Ordered in ED Medications - No data to display  Results for orders placed or performed during the hospital encounter of 09/06/17  Lipase, blood  Result Value Ref Range   Lipase 26 11 - 51 U/L  Comprehensive metabolic panel  Result Value Ref Range   Sodium 137 135 - 145 mmol/L   Potassium 3.7 3.5 - 5.1 mmol/L   Chloride 100 (L) 101 - 111 mmol/L   CO2 27 22 - 32 mmol/L   Glucose, Bld 120 (H) 65 - 99 mg/dL   BUN 13 6 - 20 mg/dL   Creatinine, Ser 1.611.19 0.61 - 1.24 mg/dL   Calcium 8.5 (L) 8.9 - 10.3 mg/dL   Total Protein 6.7 6.5 - 8.1 g/dL   Albumin 4.0 3.5 - 5.0 g/dL   AST 19 15 - 41 U/L   ALT 17 17 - 63 U/L   Alkaline Phosphatase 68 38 - 126 U/L   Total Bilirubin 0.7 0.3 - 1.2 mg/dL   GFR calc non Af Amer >60 >60 mL/min   GFR calc Af Amer >60 >60 mL/min   Anion gap 10 5 - 15  CBC  Result Value Ref Range   WBC 8.6 4.0 - 10.5 K/uL   RBC 5.02 4.22 - 5.81 MIL/uL    Hemoglobin 15.9 13.0 - 17.0 g/dL   HCT 09.646.3 04.539.0 - 40.952.0 %   MCV 92.2 78.0 - 100.0 fL   MCH 31.7 26.0 - 34.0 pg   MCHC 34.3 30.0 - 36.0 g/dL   RDW 81.112.6 91.411.5 - 78.215.5 %   Platelets 249 150 - 400 K/uL  Urinalysis, Routine w reflex microscopic  Result Value Ref Range   Color, Urine STRAW (A) YELLOW   APPearance CLEAR CLEAR   Specific Gravity, Urine 1.004 (L) 1.005 - 1.030   pH  7.0 5.0 - 8.0   Glucose, UA NEGATIVE NEGATIVE mg/dL   Hgb urine dipstick NEGATIVE NEGATIVE   Bilirubin Urine NEGATIVE NEGATIVE   Ketones, ur NEGATIVE NEGATIVE mg/dL   Protein, ur NEGATIVE NEGATIVE mg/dL   Nitrite NEGATIVE NEGATIVE   Leukocytes, UA NEGATIVE NEGATIVE   Ct Pelvis W Contrast  Result Date: 09/06/2017 CLINICAL DATA:  Prostatic infection, increased pain, difficulty walking and urinating, on antibiotics EXAM: CT PELVIS WITH CONTRAST TECHNIQUE: Multidetector CT imaging of the pelvis was performed using the standard protocol following the bolus administration of intravenous contrast. Sagittal and coronal MPR images reconstructed from axial data set. CONTRAST:  ISOVUE-300 IOPAMIDOL (ISOVUE-300) INJECTION 61% IV. No oral contrast. COMPARISON:  None FINDINGS: Urinary Tract: Ureters and bladder normal appearance for degree of bladder distention Bowel: Normal appendix. Mild rectal wall thickening question proctitis. Remaining visualized pelvic bowel loops unremarkable. Vascular/Lymphatic: Vascular structures patent.  No adenopathy. Reproductive: Mild diffuse prostatic enhancement without focal mass. Seminal vesicles unremarkable. Other: Stranding is seen within the pelvis adjacent to the seminal vesicles, prostate gland and rectum. Small amount of free pelvic fluid. No free air. No discrete fluid collection/abscess. Musculoskeletal: Osseous structures unremarkable. IMPRESSION: Mild edema in infiltration of tissue planes is seen adjacent to the seminal vesicles and prostate gland which could reflect  infection/inflammatory process. In addition however, mild rectal wall thickening and small amount of free pelvic fluid are identified which could also reflect proctitis; proctoscopic evaluation recommended. Electronically Signed   By: Ulyses Southward M.D.   On: 09/06/2017 16:55   Initial Impression / Assessment and Plan / ED Course  I have reviewed the triage vital signs and the nursing notes.  Pertinent labs & imaging results that were available during my care of the patient were reviewed by me and considered in my medical decision making (see chart for details). In light of proctitis seen on rectal exam will prescribe Flagyl in addition to Cipro which patient will start tomorrow prescribed by Dr.Luking.  Also prescribed Norco for pain.    Declined pain medicine here as he is driving home [place] Controlled Substance reporting System queried  Final Clinical Impressions(s) / ED Diagnoses  Dx proctitis Final diagnoses:  None    ED Discharge Orders    None       Doug Sou, MD 09/06/17 [phone]

## 2017-09-07 ENCOUNTER — Telehealth: Payer: Self-pay | Admitting: Nurse Practitioner

## 2017-09-07 ENCOUNTER — Encounter: Payer: Self-pay | Admitting: Internal Medicine

## 2017-09-07 LAB — RPR: RPR Ser Ql: NONREACTIVE

## 2017-09-07 LAB — HIV ANTIBODY (ROUTINE TESTING W REFLEX): HIV Screen 4th Generation wRfx: NONREACTIVE

## 2017-09-07 NOTE — Telephone Encounter (Signed)
Pt seen in the ER and was recommended to follow up with us. Please advise if we can accept him as a new patient.

## 2017-09-07 NOTE — Telephone Encounter (Signed)
OV made and letter mailed °

## 2017-09-07 NOTE — Telephone Encounter (Signed)
Yes, ok to schedule

## 2017-09-12 ENCOUNTER — Telehealth: Payer: Self-pay | Admitting: Family Medicine

## 2017-09-12 DIAGNOSIS — K6289 Other specified diseases of anus and rectum: Secondary | ICD-10-CM

## 2017-09-12 NOTE — Telephone Encounter (Addendum)
Referral ordered in Epic. Patient notified. 

## 2017-09-12 NOTE — Telephone Encounter (Signed)
[place] [facility] I doc

## 2017-09-12 NOTE — Telephone Encounter (Signed)
Patient went to the ER on 09/06/17 for proctitis.  He said the ER told him to follow up with Dr. Jena Gauss.  Dr. Luvenia Starch office cannot see him until 10/26/17.  He wants to know if we can refer him somewhere that can see him sooner?

## 2017-09-13 ENCOUNTER — Telehealth: Payer: Self-pay | Admitting: Family Medicine

## 2017-09-13 ENCOUNTER — Ambulatory Visit (INDEPENDENT_AMBULATORY_CARE_PROVIDER_SITE_OTHER): Payer: 59 | Admitting: Family Medicine

## 2017-09-13 ENCOUNTER — Encounter: Payer: Self-pay | Admitting: Family Medicine

## 2017-09-13 VITALS — BP 112/70 | Temp 98.7°F | Ht 70.0 in | Wt 194.0 lb

## 2017-09-13 DIAGNOSIS — N410 Acute prostatitis: Secondary | ICD-10-CM | POA: Diagnosis not present

## 2017-09-13 DIAGNOSIS — F419 Anxiety disorder, unspecified: Secondary | ICD-10-CM | POA: Diagnosis not present

## 2017-09-13 DIAGNOSIS — K6289 Other specified diseases of anus and rectum: Secondary | ICD-10-CM | POA: Diagnosis not present

## 2017-09-13 DIAGNOSIS — R1030 Lower abdominal pain, unspecified: Secondary | ICD-10-CM

## 2017-09-13 MED ORDER — ALPRAZOLAM 1 MG PO TABS: ORAL_TABLET | ORAL | 1 refills | Status: DC

## 2017-09-13 MED ORDER — PAROXETINE HCL 30 MG PO TABS: ORAL_TABLET | ORAL | 5 refills | Status: DC

## 2017-09-13 MED ORDER — PAROXETINE HCL 30 MG PO TABS: 20.0000 mg | ORAL_TABLET | Freq: Every day | ORAL | 5 refills | Status: DC

## 2017-09-13 NOTE — Telephone Encounter (Signed)
Patient states he cant work due to antibiotics up setting his stomach still waiting on referral to GI doctor.He has been taking both antibiotics cipro 500 mg and doxycycline 100 mg.He wants to speak with someone about this.

## 2017-09-13 NOTE — Telephone Encounter (Signed)
Antibiotics are upsetting his stomach and cant work- has appt with GI in [place] 10/26/17- trying to see if an office in [place] will see him sooner but no appt yet please advise.  Patient seen in ER 09/06/17 for proctitis- they prescribed medications

## 2017-09-13 NOTE — Telephone Encounter (Signed)
Patient scheduled office visit today for an ER follow up with Dr Brett Canales.

## 2017-09-13 NOTE — Progress Notes (Signed)
   Subjective:    Patient ID: Jay Garcia, male    DOB: 04/07/1986, 32 y.o.   MRN: [id number]  HPIFollow up ED visit for proctitis. Pt is taking  Flagyl and cipro. States he is not doing any better.    FREQUENT GOING TO THE BATHROOM   STOOLS ON THE LOOSE SIDE  nd feeling of inability to evacutate   Discomfort and tenderness  Prostate region was tende and rxee on last exam.  No known fam hx of inflam bowel disease   Reports progressive challenges with loose stools and tenesmus for months now.  Also has had recurrent prostate infection.  States this is affecting his anxiety considerably.     Review of Systems No headache, no major weight loss or weight gain, no chest pain no back pain abdominal pain no change in bowel habits complete ROS otherwise negative     Objective:   Physical Exam  Alert and oriented, vitals reviewed and stable, NAD ENT-TM's and ext canals WNL bilat via otoscopic exam Soft palate, tonsils and post pharynx WNL via oropharyngeal exam Neck-symmetric, no masses; thyroid nonpalpable and nontender Pulmonary-no tachypnea or accessory muscle use; Clear without wheezes via auscultation Card--no abnrml murmurs, rhythm reg and rate WNL Carotid pulses symmetric, without bruits Lower abdomen no obvious tenderness.  Prostate gland inflamed as noted      Assessment & Plan:  1 impression worsening anxiety.  Options discussed.  Will increase Paxil to 30 mg rationale discussed  2.  Recurrent prostatitis.  Also discussed maintain antibiotics rationale discussed  3.  Distal proctitis.  Patient will need to see GI specialist this is discussed.  Unfortunately having difficulty obtaining prompt evaluation which is an ongoing challenge with all of our specialist, discussed with patient, see no indication for ER referral  Diet exercise discussed medications clarified follow-up as scheduled  Greater than 50% of this 25 minute face to face visit was spent in  counseling and discussion and coordination of care regarding the above diagnosis/diagnosies

## 2017-09-13 NOTE — Telephone Encounter (Signed)
I think swe should see pt, all of this the last few weeks has been via p[facility] management or ER , rec ov tomor

## 2017-09-13 NOTE — Progress Notes (Signed)
   Subjective:    Patient ID: Jay Garcia, male    DOB: [birth year], 32 y.o.   MRN: [id number]  HPI    Review of Systems     Objective:   Physical Exam        Assessment & Plan:

## 2017-09-13 NOTE — Patient Instructions (Signed)
Ulcerative Colitis, Adult Ulcerative colitis is long-lasting (chronic) swelling (inflammation) of the large intestine (colon). Sores (ulcers) may also form on the colon. Ulcerative colitis is closely related to another condition of inflammation of the intestines that is called Crohn disease. Together, they are frequently referred to as inflammatory bowel disease (IBD). What are the causes? Ulcerative colitis is caused by increased activity of the immune system in the intestines. The immune system is the system that protects the body against harmful bacteria, viruses, fungi, and other things that can make you sick. When the immune system overacts, it causes inflammation. The cause of the increased immune system activity is not known. What increases the risk? Risk factors of ulcerative colitis include:  Age. This includes: ? Being 15-30 years old. ? Being older than 32 years old.  Having a family history of ulcerative colitis.  Being of Jewish descent.  What are the signs or symptoms? Common symptoms of ulcerative colitis include rectal bleeding and diarrhea. There is a wide range of symptoms, and a person's symptoms depend on how severe the condition is. Additional symptoms may include:  Pain or cramping in the belly (abdomen).  Fever.  Fatigue.  Weight loss.  Night sweats.  Rectal pain.  Feeling the immediate need to have a bowel movement.  Nausea.  Loss of appetite.  Anemia.  Joint pain or soreness.  Eye irritation.  Certain skin rashes.  How is this diagnosed? Ulcerative colitis may be diagnosed by:  Medical history and physical exam.  Blood tests and stool tests.  X-rays.  CT scans.  Colonoscopy. For this test, a flexible tube is inserted into your anus and your colon is examined.  Examination of a tissue sample from your colon (biopsy).  How is this treated? Treatment for ulcerative colitis may include medicines to:  Decrease inflammation.  Control  your immune system.  Surgery may also be necessary. Follow these instructions at home: Medicines and vitamins  Take medicines only as directed by your doctor. Do not take aspirin.  Ask your doctor if you should take any vitamins or supplements. Lifestyle  Exercise regularly.  Limit alcohol intake to no more than 1 drink per day for nonpregnant women and 2 drinks per day for men. One drink equals 12 ounces of beer, 5 ounces of wine, or 1 ounces of hard liquor. Eating and drinking  Drink enough fluid to keep your urine clear or pale yellow.  Ask your health care provider about the best diet for you. Follow the diet as directed by your health care provider. This may include: ? Avoiding carbonated drinks. ? Avoiding popcorn, vegetable skins, nuts, and other high-fiber foods when you have symptoms of ulcerative colitis. ? Eating smaller meals more often. ? Keeping a food diary. This may help you to find and avoid any foods that make you feel not well.  Limit your caffeine intake. General instructions  Keep all follow-up appointments as directed by your health care provider. This is important. Contact a health care provider if:  Your symptoms do not improve or get worse with treatment.  You continue to lose weight.  You have constant cramps or loose bowels.  You develop a new skin rash, skin sores, or eye problems.  You have a fever or chills. Get help right away if:  You have bloody diarrhea.  You have severe pain in your abdomen.  You vomit. This information is not intended to replace advice given to you by your health care provider. Make sure   you discuss any questions you have with your health care provider. Document Released: 02/10/2005 Document Revised: 01/04/2016 Document Reviewed: 08/26/2014 Elsevier Interactive Patient Education  2018 [employer]. Colitis Colitis is inflammation of the colon. Colitis may last a short time (acute) or it may last a long time  (chronic). What are the causes? This condition may be caused by:  Viruses.  Bacteria.  Reactions to medicine.  Certain autoimmune diseases, such as Crohn disease or ulcerative colitis.  What are the signs or symptoms? Symptoms of this condition include:  Diarrhea.  Passing bloody or tarry stool.  Pain.  Fever.  Vomiting.  Tiredness (fatigue).  Weight loss.  Bloating.  Sudden increase in abdominal pain.  Having fewer bowel movements than usual.  How is this diagnosed? This condition is diagnosed with a stool test or a blood test. You may also have other tests, including X-rays, a CT scan, or a colonoscopy. How is this treated? Treatment may include:  Resting the bowel. This involves not eating or drinking for a period of time.  Fluids that are given through an IV tube.  Medicine for pain and diarrhea.  Antibiotic medicines.  Cortisone medicines.  Surgery.  Follow these instructions at home: Eating and drinking  Follow instructions from your health care provider about eating or drinking restrictions.  Drink enough fluid to keep your urine clear or pale yellow.  Work with a dietitian to determine which foods cause your condition to flare up.  Avoid foods that cause flare-ups.  Eat a well-balanced diet. Medicines  Take over-the-counter and prescription medicines only as told by your health care provider.  If you were prescribed an antibiotic medicine, take it as told by your health care provider. Do not stop taking the antibiotic even if you start to feel better. General instructions  Keep all follow-up visits as told by your health care provider. This is important. Contact a health care provider if:  Your symptoms do not go away.  You develop new symptoms. Get help right away if:  You have a fever that does not go away with treatment.  You develop chills.  You have extreme weakness, fainting, or dehydration.  You have repeated  vomiting.  You develop severe pain in your abdomen.  You pass bloody or tarry stool. This information is not intended to replace advice given to you by your health care provider. Make sure you discuss any questions you have with your health care provider. Document Released: 06/10/2004 Document Revised: 10/09/2015 Document Reviewed: 08/26/2014 Elsevier Interactive Patient Education  [employer].

## 2017-09-16 ENCOUNTER — Telehealth: Payer: Self-pay | Admitting: Family Medicine

## 2017-09-16 NOTE — Telephone Encounter (Signed)
Will do this weekend 

## 2017-09-16 NOTE — Telephone Encounter (Signed)
Need 09/13/17 OV note complete so I may send with referral  Pt is seeing Dr. Bosie Clos @ [facility] on 09/29/17  Please advise

## 2017-09-18 DIAGNOSIS — K6289 Other specified diseases of anus and rectum: Secondary | ICD-10-CM | POA: Insufficient documentation

## 2017-09-19 NOTE — Telephone Encounter (Signed)
Note faxed with referral. 

## 2017-10-26 ENCOUNTER — Telehealth: Payer: Self-pay | Admitting: Nurse Practitioner

## 2017-10-26 ENCOUNTER — Encounter: Payer: Self-pay | Admitting: Nurse Practitioner

## 2017-10-26 ENCOUNTER — Ambulatory Visit: Payer: 59 | Admitting: Nurse Practitioner

## 2017-10-26 NOTE — Telephone Encounter (Signed)
Noted  

## 2017-10-26 NOTE — Telephone Encounter (Signed)
PATIENT WAS A NO SHOW AND LETTER SENT  °

## 2017-11-14 ENCOUNTER — Encounter: Payer: Self-pay | Admitting: Family Medicine

## 2017-11-14 ENCOUNTER — Ambulatory Visit (INDEPENDENT_AMBULATORY_CARE_PROVIDER_SITE_OTHER): Payer: 59 | Admitting: Family Medicine

## 2017-11-14 VITALS — BP 122/80 | Temp 97.7°F | Ht 70.0 in | Wt 197.0 lb

## 2017-11-14 DIAGNOSIS — M5432 Sciatica, left side: Secondary | ICD-10-CM

## 2017-11-14 MED ORDER — PREDNISONE 20 MG PO TABS: ORAL_TABLET | ORAL | 0 refills | Status: DC

## 2017-11-14 MED ORDER — HYDROCODONE-ACETAMINOPHEN 10-325 MG PO TABS: 1.0000 | ORAL_TABLET | Freq: Four times a day (QID) | ORAL | 0 refills | Status: AC | PRN

## 2017-11-14 NOTE — Patient Instructions (Addendum)
Sciatica Sciatica is pain, numbness, weakness, or tingling along your sciatic nerve. The sciatic nerve starts in the lower back and goes down the back of each leg. Sciatica happens when this nerve is pinched or has pressure put on it. Sciatica usually goes away on its own or with treatment. Sometimes, sciatica may keep coming back (recur). Follow these instructions at home: Medicines  Take over-the-counter and prescription medicines only as told by your doctor.  Do not drive or use heavy machinery while taking prescription pain medicine. Managing pain  If directed, put ice on the affected area. ? Put ice in a plastic bag. Place a towel between your skin and the bag.   Back Exercises If you have pain in your back, do these exercises 2-3 times each day or as told by your doctor. When the pain goes away, do the exercises once each day, but repeat the steps more times for each exercise (do more repetitions). If you do not have pain in your back, do these exercises once each day or as told by your doctor. Exercises Single Knee to Chest  Do these steps 3-5 times in a row for each leg: Lie on your back on a firm bed or the floor with your legs stretched out. Bring one knee to your chest. Hold your knee to your chest by grabbing your knee or thigh. Pull on your knee until you feel a gentle stretch in your lower back. Keep doing the stretch for 10-30 seconds. Slowly let go of your leg and straighten it.  Pelvic Tilt  Do these steps 5-10 times in a row: Lie on your back on a firm bed or the floor with your legs stretched out. Bend your knees so they point up to the ceiling. Your feet should be flat on the floor. Tighten your lower belly (abdomen) muscles to press your lower back against the floor. This will make your tailbone point up to the ceiling instead of pointing down to your feet or the floor. Stay in this position for 5-10 seconds while you gently tighten your muscles and breathe  evenly.  Cat-Cow  Do these steps until your lower back bends more easily: Get on your hands and knees on a firm surface. Keep your hands under your shoulders, and keep your knees under your hips. You may put padding under your knees. Let your head hang down, and make your tailbone point down to the floor so your lower back is round like the back of a cat. Stay in this position for 5 seconds. Slowly lift your head and make your tailbone point up to the ceiling so your back hangs low (sags) like the back of a cow. Stay in this position for 5 seconds.  Press-Ups  Do these steps 5-10 times in a row: Lie on your belly (face-down) on the floor. Place your hands near your head, about shoulder-width apart. While you keep your back relaxed and keep your hips on the floor, slowly straighten your arms to raise the top half of your body and lift your shoulders. Do not use your back muscles. To make yourself more comfortable, you may change where you place your hands. Stay in this position for 5 seconds. Slowly return to lying flat on the floor.  Bridges  Do these steps 10 times in a row: Lie on your back on a firm surface. Bend your knees so they point up to the ceiling. Your feet should be flat on the floor. Tighten your  butt muscles and lift your butt off of the floor until your waist is almost as high as your knees. If you do not feel the muscles working in your butt and the back of your thighs, slide your feet 1-2 inches farther away from your butt. Stay in this position for 3-5 seconds. Slowly lower your butt to the floor, and let your butt muscles relax.  If this exercise is too easy, try doing it with your arms crossed over your chest. Belly Crunches  Do these steps 5-10 times in a row: Lie on your back on a firm bed or the floor with your legs stretched out. Bend your knees so they point up to the ceiling. Your feet should be flat on the floor. Cross your arms over your chest. Tip  your chin a little bit toward your chest but do not bend your neck. Tighten your belly muscles and slowly raise your chest just enough to lift your shoulder blades a tiny bit off of the floor. Slowly lower your chest and your head to the floor.  Back Lifts Do these steps 5-10 times in a row: Lie on your belly (face-down) with your arms at your sides, and rest your forehead on the floor. Tighten the muscles in your legs and your butt. Slowly lift your chest off of the floor while you keep your hips on the floor. Keep the back of your head in line with the curve in your back. Look at the floor while you do this. Stay in this position for 3-5 seconds. Slowly lower your chest and your face to the floor.  Contact a doctor if: Your back pain gets a lot worse when you do an exercise. Your back pain does not lessen 2 hours after you exercise. If you have any of these problems, stop doing the exercises. Do not do them again unless your doctor says it is okay. Get help right away if: You have sudden, very bad back pain. If this happens, stop doing the exercises. Do not do them again unless your doctor says it is okay. This information is not intended to replace advice given to you by your health care provider. Make sure you discuss any questions you have with your health care provider. Document Released: 06/05/2010 Document Revised: 10/09/2015 Document Reviewed: 06/27/2014 Elsevier Interactive Patient Education  2018 [employer]. ?  ? Leave the ice on for 20 minutes, 2-3 times a day.  After icing, apply heat to the affected area before you exercise or as often as told by your doctor. Use the heat source that your doctor tells you to use, such as a moist heat pack or a heating pad. ? Place a towel between your skin and the heat source. ? Leave the heat on for 20-30 minutes. ? Remove the heat if your skin turns bright red. This is especially important if you are unable to feel pain, heat, or cold.  You may have a greater risk of getting burned. Activity  Return to your normal activities as told by your doctor. Ask your doctor what activities are safe for you. ? Avoid activities that make your sciatica worse.  Take short rests during the day. Rest in a lying or standing position. This is usually better than sitting to rest. ? When you rest for a long time, do some physical activity or stretching between periods of rest. ? Avoid sitting for a long time without moving. Get up and move around at least one time  each hour.  Exercise and stretch regularly, as told by your doctor.  Do not lift anything that is heavier than 10 lb (4.5 kg) while you have symptoms of sciatica. ? Avoid lifting heavy things even when you do not have symptoms. ? Avoid lifting heavy things over and over.  When you lift objects, always lift in a way that is safe for your body. To do this, you should: ? Bend your knees. ? Keep the object close to your body. ? Avoid twisting. General instructions  Use good posture. ? Avoid leaning forward when you are sitting. ? Avoid hunching over when you are standing.  Stay at a healthy weight.  Wear comfortable shoes that support your feet. Avoid wearing high heels.  Avoid sleeping on a mattress that is too soft or too hard. You might have less pain if you sleep on a mattress that is firm enough to support your back.  Keep all follow-up visits as told by your doctor. This is important. Contact a doctor if:  You have pain that: ? Wakes you up when you are sleeping. ? Gets worse when you lie down. ? Is worse than the pain you have had in the past. ? Lasts longer than 4 weeks.  You lose weight for without trying. Get help right away if:  You cannot control when you pee (urinate) or poop (have a bowel movement).  You have weakness in any of these areas and it gets worse. ? Lower back. ? Lower belly (pelvis). ? Butt (buttocks). ? Legs.  You have redness or  swelling of your back.  You have a burning feeling when you pee. This information is not intended to replace advice given to you by your health care provider. Make sure you discuss any questions you have with your health care provider. Document Released: 02/10/2008 Document Revised: 10/09/2015 Document Reviewed: 01/10/2015 Elsevier Interactive Patient Education  [employer]2018 [employer]

## 2017-11-14 NOTE — Progress Notes (Signed)
   Subjective:    Patient ID: Jay Garcia, male    DOB: 11/24/1985, 32 y.o.   MRN: [id number]  Back Pain  This is a new problem. The current episode started in the past 7 days (3 days). The problem occurs constantly. The problem has been gradually worsening since onset. The pain is present in the lumbar spine. The quality of the pain is described as aching. The pain radiates to the left thigh (left leg). The pain is at a severity of 8/10. The pain is moderate. The pain is the same all the time. The symptoms are aggravated by bending and position. Associated symptoms include leg pain, paresthesias and weakness. Pertinent negatives include no abdominal pain, bladder incontinence, bowel incontinence, chest pain, paresis, pelvic pain or perianal numbness. Treatments tried: ibuprofen.   Severe pain radiating down the left leg hurts to walk hurts to move hurts to sit up stand up and get up onto a table   Review of Systems  Cardiovascular: Negative for chest pain.  Gastrointestinal: Negative for abdominal pain and bowel incontinence.  Genitourinary: Negative for bladder incontinence and pelvic pain.  Musculoskeletal: Positive for back pain.  Neurological: Positive for weakness and paresthesias.       Objective:   Physical Exam  Lungs clear respiratory rate normal heart regular no murmurs low back moderate tenderness positive straight leg raise on the left negative on the right strength is good bilateral      Assessment & Plan:  Back pain Sciatica Prednisone taper Pain medication for home use only short-term use only Pain should get better over the course the next several weeks If not improving over the next couple weeks recommend physical therapy Exercises shared Follow-up within 6 weeks Follow-up sooner problems currently MRI not indicated Patient highly interested in getting the MRI.  I encouraged him for now  To hold off on this If he has persistent pain that is worsening or  weakness MRI will be indicated warnings were discussed

## 2017-11-16 ENCOUNTER — Ambulatory Visit (INDEPENDENT_AMBULATORY_CARE_PROVIDER_SITE_OTHER): Payer: 59 | Admitting: Family Medicine

## 2017-11-16 ENCOUNTER — Encounter: Payer: Self-pay | Admitting: Family Medicine

## 2017-11-16 VITALS — BP 128/88 | Temp 98.3°F | Ht 70.0 in | Wt 193.0 lb

## 2017-11-16 DIAGNOSIS — M5432 Sciatica, left side: Secondary | ICD-10-CM

## 2017-11-16 MED ORDER — OXYCODONE-ACETAMINOPHEN 5-325 MG PO TABS: ORAL_TABLET | ORAL | 0 refills | Status: DC

## 2017-11-16 NOTE — Progress Notes (Signed)
   Subjective:    Patient ID: Jay Garcia, male    DOB: 05/19/1985, 32 y.o.   MRN: [id number] Patient arrives for a very protracted discussion regarding concerns  See prior note   HPI Pt here due to itching. Pt thinks it may be caused by the med given to him when he was here on 11/14/2017. Pt prescribed Hydrocodone and Prednisone. Pt states his back is killing him. Has not taken a Hydrocodone today.Marland Kitchen.  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  Has had flares of pain off and on since then.  This 1 is more serious than he is ever had.  Describes deep low back pain.  Radiates into the left leg all the way down to the ankle.  Trouble walking.  Pain is intense.  To take in nature.  Substantial pain in the posterior buttock  Patient went into great length on his challenges with pain medicine.  High doses of hydrocodone because itching.  Low doses of hydrocodone do not seem to adequately cover pain     Review of Systems Positive left leg straight leg raise.  Positive left sciatic notch tenderness.No headache, no major weight loss or weight gain, no chest pain no back pain abdominal pain no change in bowel habits complete ROS otherwise negative     Objective:   Physical Exam  Alert and oriented, vitals reviewed and stable, NAD ENT-TM's and ext canals WNL bilat via otoscopic exam Soft palate, tonsils and post pharynx WNL via oropharyngeal exam Neck-symmetric, no masses; thyroid nonpalpable and nontender Pulmonary-no tachypnea or accessory muscle use; Clear without wheezes via auscultation Card--no abnrml murmurs, rhythm reg and rate WNL Carotid pulses symmetric, without bruits       Assessment & Plan:  1 impression sciatica.  On prednisone.  No substantial improvement.  Current hydrocodone dose not sufficient for pain control.  Discussed.  Multiple  options discussed.  Patient had numerous questions.  And numerous concerns.  He would like to know urgently what is going on with his spine.  New limitations with recommendations and insurance realities discussed at great length numerous questions asked will give trial of oxycodone.  Follow-up as scheduled  Greater than 50% of this 25 minute face to face visit was spent in counseling and discussion and coordination of care regarding the above diagnosis/diagnosies

## 2017-11-28 ENCOUNTER — Encounter: Payer: Self-pay | Admitting: Family Medicine

## 2017-11-28 ENCOUNTER — Ambulatory Visit (INDEPENDENT_AMBULATORY_CARE_PROVIDER_SITE_OTHER): Payer: 59 | Admitting: Family Medicine

## 2017-11-28 VITALS — BP 118/76 | Temp 98.8°F | Ht 70.0 in | Wt 185.6 lb

## 2017-11-28 DIAGNOSIS — Z9189 Other specified personal risk factors, not elsewhere classified: Secondary | ICD-10-CM | POA: Diagnosis not present

## 2017-11-28 MED ORDER — DOXYCYCLINE HYCLATE 100 MG PO TABS: 100.0000 mg | ORAL_TABLET | Freq: Two times a day (BID) | ORAL | 0 refills | Status: DC

## 2017-11-28 MED ORDER — ONDANSETRON 4 MG PO TBDP
4.0000 mg | ORAL_TABLET | Freq: Three times a day (TID) | ORAL | 0 refills | Status: DC | PRN
Start: 2017-11-28 — End: 2019-07-02

## 2017-11-28 NOTE — Progress Notes (Signed)
   Subjective:    Patient ID: Jay Garcia, male    DOB: 03/04/1986, 32 y.o.   MRN: [id number]  HPI  Patient arrives with nausea and body aches s/p tick bite.   Took pred for sciatica, overall helped the back  Went outdoors on Thursday  Found a tick on the right shoulder, weird looking and on the arge side  Flu like symtoms  Hips and legs and back achig  Having substantial sweats Sweating a nd feeling very ht   Feeling dehyderated    pt feels bad and nauseaous ,  Has vomited a coupe tines  Felt bad   Ibuprofen prn ha and fever  Feels vry bad   Some diarrhea, not much now, very achey in joints, takes ibu three t a time prn , hndling ok      Review of Systems No headache, no major weight loss or weight gain, no chest pain no back pain abdominal pain no change in bowel habits complete ROS otherwise negative     Objective:   Physical Exam Alert active mild malaise.  HEENT some nasal congestion pharynx normal.  Neck supple.  Lungs clear.  Heart regular rhythm abdominal exam benign       Assessment & Plan:  Impression probable viral syndrome does need to cover for slight chance of tickborne illness discussed at great length plan initiate doxycycline twice daily.  Rationale discussed Zofran as needed for nausea

## 2017-11-28 NOTE — Patient Instructions (Signed)
     One out of 200 tick bites, result tickborne illnesss  Tick born infxn nearly always require tick 24 hrs,,  It is true one out of four folks who present to the medical setting for the first time with tick borne illness have no rash   The incubation period on all of the tick born illnesses is usually anywhere from 48 hrs after bite to ten to twelve days   Blood work thr first couple weeks of illness is notoriously unreliable, can miss erious things, can overcall serious things  Of all the tick borne illness your symptoms are most consistent with something call ehrlichiosis (quite uncommon  and you likely do not have it, but we will cover you for the possibility)  Most likely dx is summertime viurus which can often be the roughest viruses you'll ever have, there are a number that cause a flu like presentation in the middle of the summer

## 2017-12-28 ENCOUNTER — Ambulatory Visit: Payer: 59 | Admitting: Family Medicine

## 2017-12-28 DIAGNOSIS — Z029 Encounter for administrative examinations, unspecified: Secondary | ICD-10-CM

## 2018-02-06 ENCOUNTER — Encounter: Payer: Self-pay | Admitting: Family Medicine

## 2018-02-06 ENCOUNTER — Telehealth: Payer: Self-pay | Admitting: Family Medicine

## 2018-02-06 ENCOUNTER — Ambulatory Visit (INDEPENDENT_AMBULATORY_CARE_PROVIDER_SITE_OTHER): Payer: 59 | Admitting: Family Medicine

## 2018-02-06 VITALS — Ht 70.0 in | Wt 180.4 lb

## 2018-02-06 DIAGNOSIS — F902 Attention-deficit hyperactivity disorder, combined type: Secondary | ICD-10-CM | POA: Diagnosis not present

## 2018-02-06 DIAGNOSIS — F419 Anxiety disorder, unspecified: Secondary | ICD-10-CM | POA: Diagnosis not present

## 2018-02-06 DIAGNOSIS — F410 Panic disorder [episodic paroxysmal anxiety] without agoraphobia: Secondary | ICD-10-CM | POA: Diagnosis not present

## 2018-02-06 MED ORDER — ALPRAZOLAM 1 MG PO TABS: ORAL_TABLET | ORAL | 1 refills | Status: DC

## 2018-02-06 MED ORDER — AZITHROMYCIN 250 MG PO TABS: ORAL_TABLET | ORAL | 0 refills | Status: DC

## 2018-02-06 MED ORDER — METHYLPHENIDATE HCL ER (OSM) 27 MG PO TBCR: 27.0000 mg | EXTENDED_RELEASE_TABLET | ORAL | 0 refills | Status: DC

## 2018-02-06 MED ORDER — PAROXETINE HCL 20 MG PO TABS: ORAL_TABLET | ORAL | 1 refills | Status: DC

## 2018-02-06 NOTE — Progress Notes (Signed)
   Subjective:    Patient ID: Jay Garcia, male    DOB: 11/30/1985, 32 y.o.   MRN: [id number]  HPI Pt here today for depression.   States over the last few yrs has had more challenges  Has had adhd, when he took the concerta it helped things a lot   Has had a lot f stress lately  Was in a new relationship, together for about a yr, coupe month ago had a lot of stress, paxil was incr to 30 from 20. Pt statees making him foggy headed, felt like too much, now feeling stress  Pt having trouble eating and sleeping, heart is beating fast t at ties   Pt now smoking cigrettes,   Pt states not suicidal, just having whats the use feelings  Pt having anger issues, losing temper a lot    Work going alright ,          Pt states he would like to get back on his combo off Paxil and Concerta. Pt states he has no motivation to get up but when he was on Concerta he felt he had motiviation and could focus more. Pt states his insurance has changed and he had to stop taking the medication. Pt has had some life changing issues happen recently with breakup. Pt states his heart races at night. Smoking cigarettes constantly. Pt states the Paxil 30 mg makes him foggy headed.    Review of Systems No headache, no major weight loss or weight gain, no chest pain no back pain abdominal pain no change in bowel habits complete ROS otherwise negative     Objective:   Physical Exam  Alert and oriented, vitals reviewed and stable, patient anxious appearing at times slightly tearful ENT-TM's and ext canals WNL bilat via otoscopic exam Soft palate, tonsils and post pharynx WNL via oropharyngeal exam Neck-symmetric, no masses; thyroid nonpalpable and nontender Pulmonary-no tachypnea or accessory muscle use; Clear without wheezes via auscultation Card--no abnrml murmurs, rhythm reg and rate WNL Carotid pulses symmetric, without bruits  Patient notes not thinking about hurting himself at all       Assessment & Plan:  Impression depression with anxiety discussed the great length.  Complicated by ADHD.  Also palpated by perceived side effects with higher dose of Paxil.  Also complicated by grief with recent break-up with girlfriend.  Patient would like to resume Concerta to help focusing.  Also notes that helped motivate him.  Would like to back off on Paxil.  We will do this.  Warning signs discussed carefully.  Patient used to be advised to use patches for nicotine rationale discussed with chamber

## 2018-02-06 NOTE — Telephone Encounter (Signed)
Pa submitted and pt is aware it could take up to 72 hour turn around time.

## 2018-02-06 NOTE — Telephone Encounter (Signed)
Patient calling in regards of pharmacy needing pre authorization for listed medication: methylphenidate 27 MG PO CR tablet   @ [id number] - [place],  - [address]

## 2018-02-09 NOTE — Telephone Encounter (Signed)
Pending approval.

## 2018-02-17 MED ORDER — CONCERTA 27 MG PO TBCR: 27.0000 mg | EXTENDED_RELEASE_TABLET | ORAL | 0 refills | Status: DC

## 2018-02-17 NOTE — Telephone Encounter (Signed)
Tried calling pt to let him know. New rx sent to pharm. rx up front for pt to pickup whenever he calls back number in chart has calling restrictions.

## 2018-02-17 NOTE — Telephone Encounter (Signed)
Use brand name concerta that is fine

## 2018-02-17 NOTE — Telephone Encounter (Signed)
Insurance denied prior auth  for methylphenidate. Patient's insurance prefers Brand name only Concerta. Please advise.

## 2018-02-20 NOTE — Telephone Encounter (Signed)
Vm not set up

## 2018-02-21 NOTE — Telephone Encounter (Signed)
Letter to pt mailed asking him to call the office.

## 2018-02-21 NOTE — Telephone Encounter (Signed)
Patient vm not set up

## 2018-03-01 ENCOUNTER — Telehealth: Payer: Self-pay | Admitting: Family Medicine

## 2018-03-01 ENCOUNTER — Other Ambulatory Visit: Payer: Self-pay | Admitting: *Deleted

## 2018-03-01 MED ORDER — VALACYCLOVIR HCL 1 G PO TABS: 1000.0000 mg | ORAL_TABLET | Freq: Three times a day (TID) | ORAL | 0 refills | Status: DC

## 2018-03-01 NOTE — Telephone Encounter (Signed)
Cluster of Blisters around left eye lid. Diagnosed with shingles. Started 5 years. Had year around end of summer except for last year. Just started yesterday. No vision issues. Had to see eye doctor last two times just to make sure his eye was ok and it was.   [facility]

## 2018-03-01 NOTE — Telephone Encounter (Signed)
Tried to call no answer

## 2018-03-01 NOTE — Telephone Encounter (Signed)
Med sent to pharm. Pt notified.  

## 2018-03-01 NOTE — Telephone Encounter (Signed)
Irritation redness where? What was diagnosis then?

## 2018-03-01 NOTE — Telephone Encounter (Signed)
Requesting Refill: valACYclovir (VALTREX) 1000 MG tablet   Pt is having irritation and redness similar symptoms as he had 2 years ago and pt stated when this happened he was prescribed listed medication.   [id number] - [place], [place] - [address] AT [address]

## 2018-03-01 NOTE — Telephone Encounter (Signed)
Pt called back and he was able to get med he needed from pharm.

## 2018-03-01 NOTE — Telephone Encounter (Signed)
Valtrex 1 g tid 7 d 

## 2018-03-01 NOTE — Telephone Encounter (Signed)
Please advise 

## 2018-03-14 ENCOUNTER — Other Ambulatory Visit: Payer: Self-pay

## 2018-03-14 MED ORDER — CONCERTA 27 MG PO TBCR: 27.0000 mg | EXTENDED_RELEASE_TABLET | ORAL | 0 refills | Status: DC

## 2018-03-20 ENCOUNTER — Ambulatory Visit: Payer: 59 | Admitting: Family Medicine

## 2018-06-07 ENCOUNTER — Other Ambulatory Visit: Payer: Self-pay | Admitting: Family Medicine

## 2018-06-21 ENCOUNTER — Other Ambulatory Visit: Payer: Self-pay | Admitting: Family Medicine

## 2018-06-29 ENCOUNTER — Encounter: Payer: Self-pay | Admitting: Family Medicine

## 2019-02-04 IMAGING — CT CT PELVIS W/ CM
2 of 3 series · 16 of 46 positions shown, 18 images · IV contrast (Isovue)
Comparison: None

CLINICAL DATA: Prostatic infection, increased pain, difficulty
walking and urinating, on antibiotics

EXAM:
CT PELVIS WITH CONTRAST
TECHNIQUE: Multidetector CT imaging of the pelvis was performed using the
standard protocol following the bolus administration of intravenous
contrast. Sagittal and coronal MPR images reconstructed from axial
data set.
CONTRAST:  100mL XVBJQK-5RR IOPAMIDOL (XVBJQK-5RR) INJECTION 61% IV.
No oral contrast.

[Series 2: axial st · axial · 0.79mm/px · z∈[-623,-393]mm · 13 of 54 slices shown, 15 images]
[im 4/54  soft-tissue]
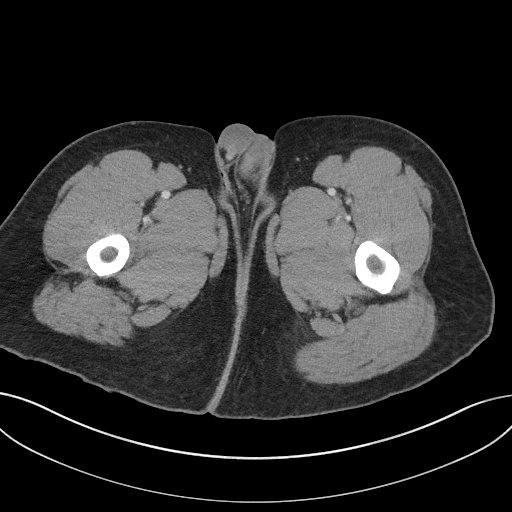
[im 4/54  bone]
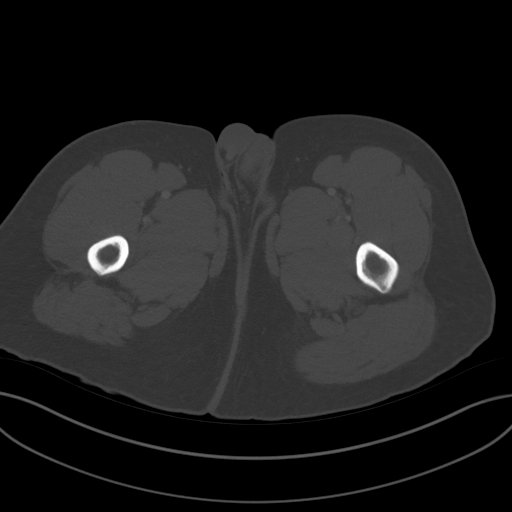
[im 7/54  soft-tissue]
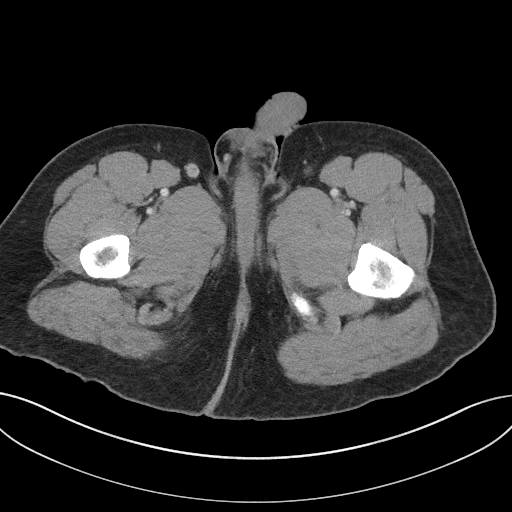
[im 11/54  soft-tissue]
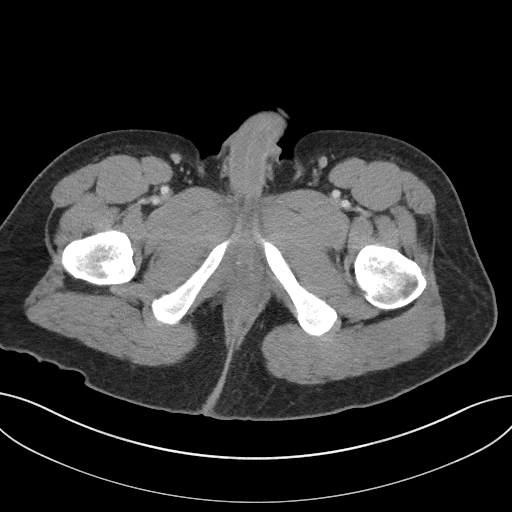
[im 16/54  soft-tissue]
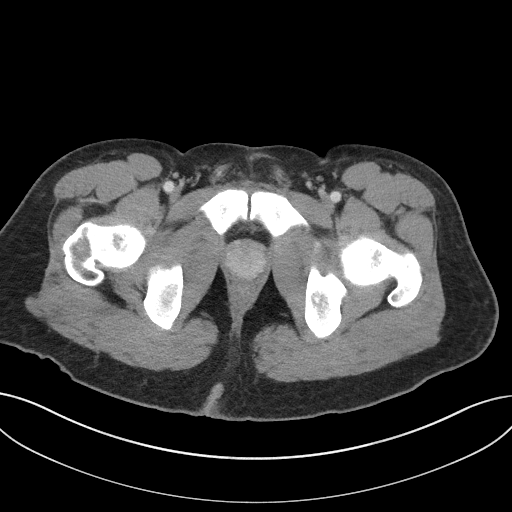
[im 19/54  soft-tissue]
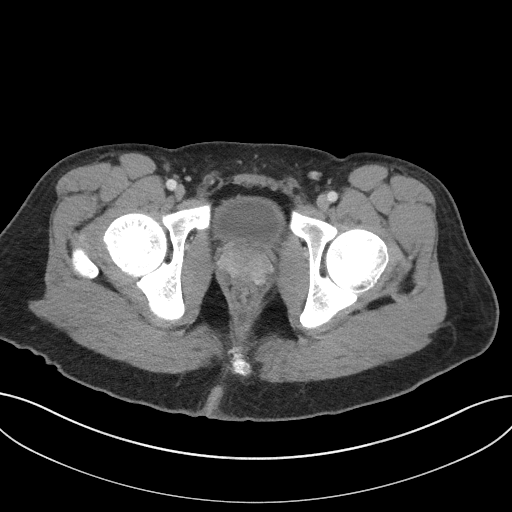
[im 23/54  soft-tissue]
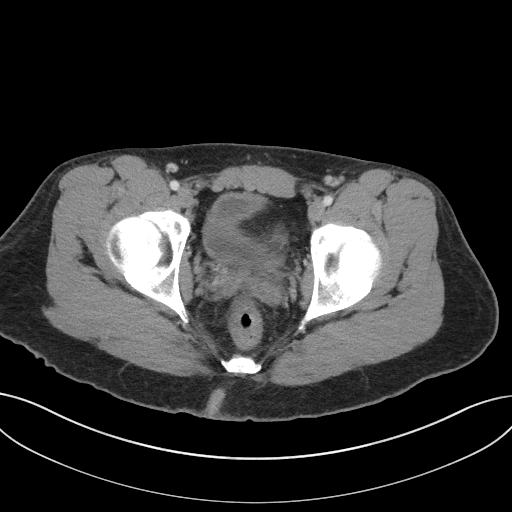
[im 28/54  soft-tissue]
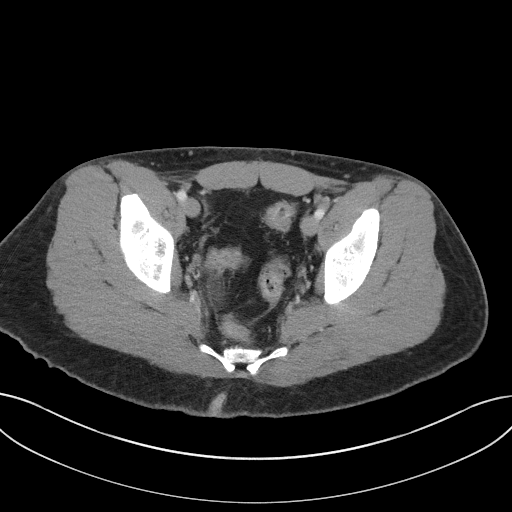
[im 31/54  soft-tissue]
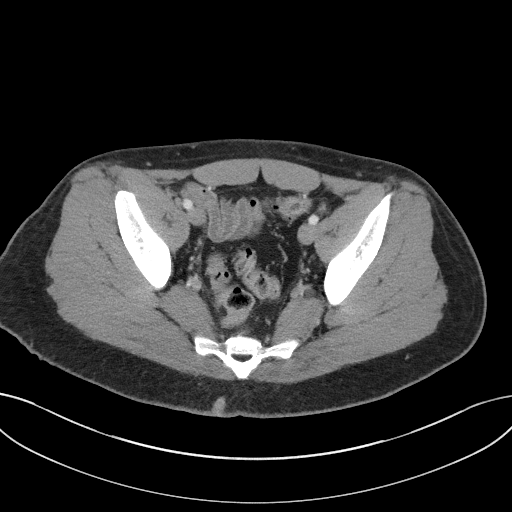
[im 35/54  soft-tissue]
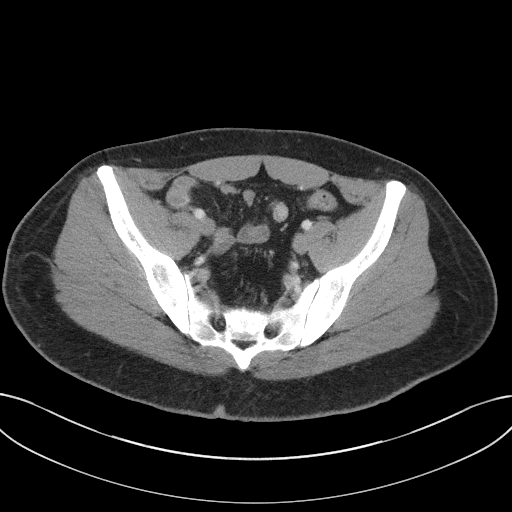
[im 35/54  bone]
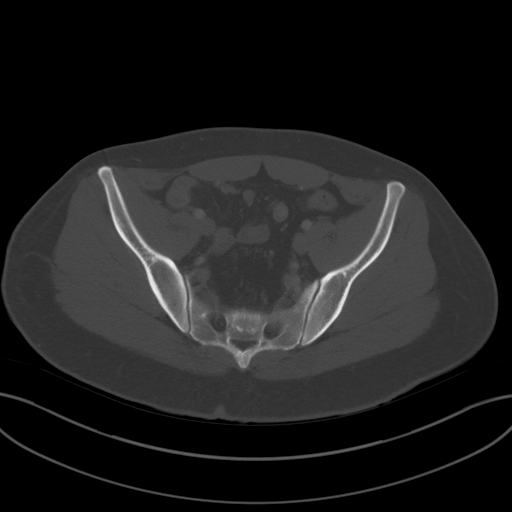
[im 38/54  soft-tissue]
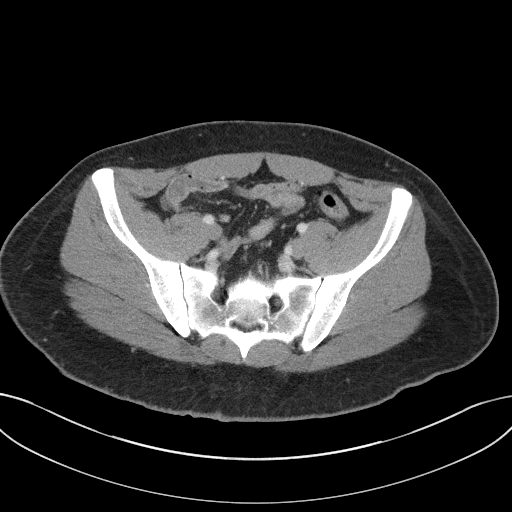
[im 43/54  soft-tissue]
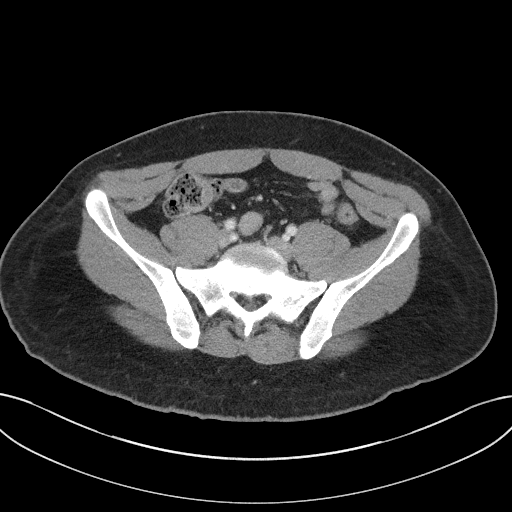
[im 47/54  soft-tissue]
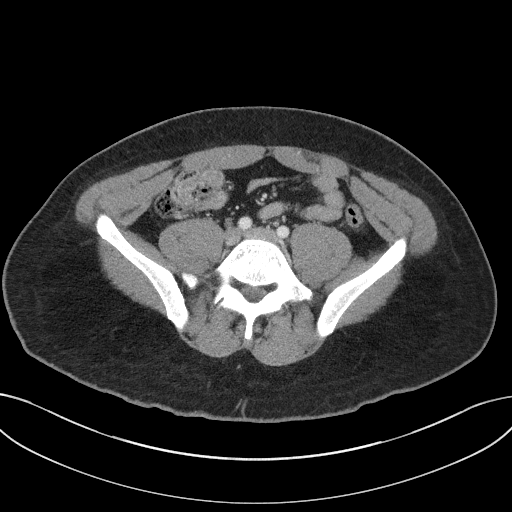
[im 50/54  soft-tissue]
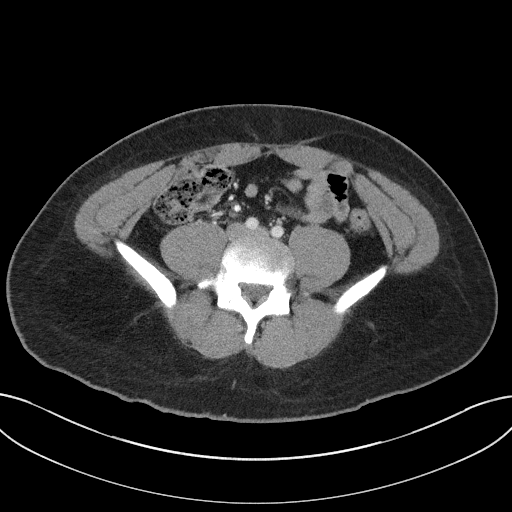

[Series 4: coronal st · coronal · 0.54mm/px · 3 of 81 slices shown]
[im 27/81  soft-tissue]
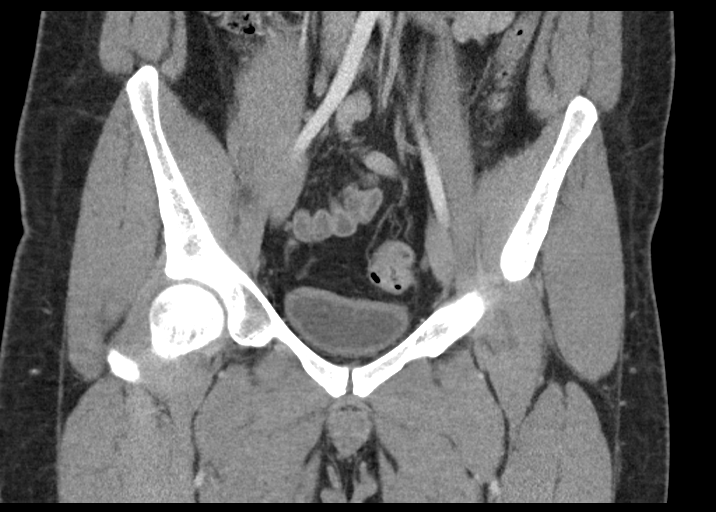
[im 36/81  soft-tissue]
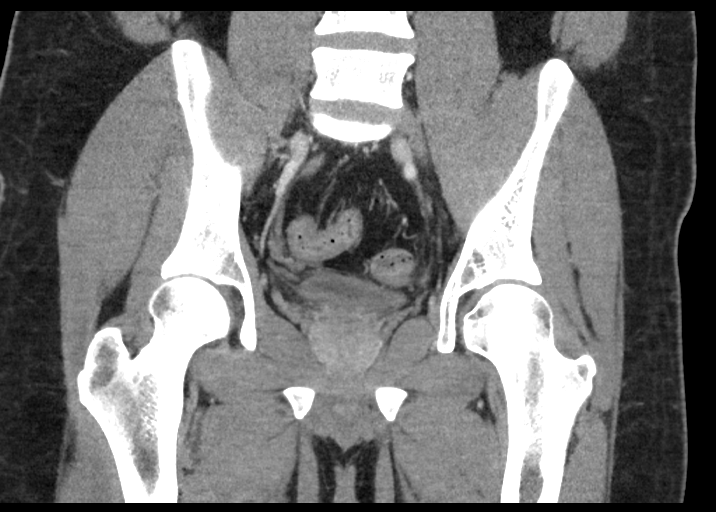
[im 45/81  soft-tissue]
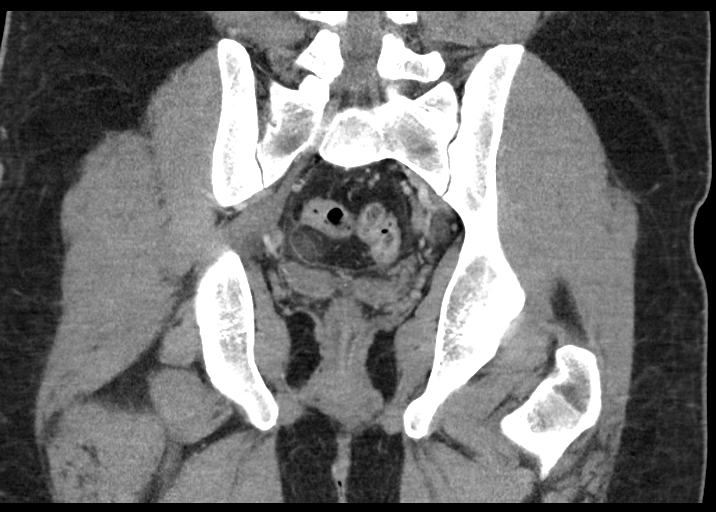

[16 of 46 positions shown; findings below may reference images not displayed]

FINDINGS: Urinary Tract: Ureters and bladder normal appearance for degree of
bladder distention

Bowel: Normal appendix. Mild rectal wall thickening question
proctitis. Remaining visualized pelvic bowel loops unremarkable.

Vascular/Lymphatic: Vascular structures patent.  No adenopathy.

Reproductive: Mild diffuse prostatic enhancement without focal mass.
Seminal vesicles unremarkable.

Other: Stranding is seen within the pelvis adjacent to the seminal
vesicles, prostate gland and rectum. Small amount of free pelvic
fluid. No free air. No discrete fluid collection/abscess.

Musculoskeletal: Osseous structures unremarkable.
IMPRESSION: Mild edema in infiltration of tissue planes is seen adjacent to the
seminal vesicles and prostate gland which could reflect
infection/inflammatory process.

In addition however, mild rectal wall thickening and small amount of
free pelvic fluid are identified which could also reflect proctitis;
proctoscopic evaluation recommended.

## 2019-02-28 ENCOUNTER — Other Ambulatory Visit: Payer: Self-pay | Admitting: Family Medicine

## 2019-06-16 ENCOUNTER — Other Ambulatory Visit: Payer: Self-pay | Admitting: Family Medicine

## 2019-06-27 ENCOUNTER — Other Ambulatory Visit: Payer: Self-pay | Admitting: Family Medicine

## 2019-07-02 ENCOUNTER — Other Ambulatory Visit: Payer: Self-pay | Admitting: Family Medicine

## 2019-07-02 ENCOUNTER — Ambulatory Visit
Admission: EM | Admit: 2019-07-02 | Discharge: 2019-07-02 | Disposition: A | Discharge status: Home / Self Care | Payer: 59 | Attending: Emergency Medicine | Admitting: Emergency Medicine

## 2019-07-02 ENCOUNTER — Other Ambulatory Visit: Payer: Self-pay

## 2019-07-02 DIAGNOSIS — F410 Panic disorder [episodic paroxysmal anxiety] without agoraphobia: Secondary | ICD-10-CM

## 2019-07-02 DIAGNOSIS — F411 Generalized anxiety disorder: Secondary | ICD-10-CM

## 2019-07-02 DIAGNOSIS — Z760 Encounter for issue of repeat prescription: Secondary | ICD-10-CM

## 2019-07-02 MED ORDER — PAROXETINE HCL 30 MG PO TABS: 30.0000 mg | ORAL_TABLET | Freq: Every day | ORAL | 0 refills | Status: DC

## 2019-07-02 NOTE — ED Provider Notes (Signed)
[facility]   [id number] 07/02/19 Arrival Time: 1525  CC: Medication refill  SUBJECTIVE:  Jay Garcia is a 34 y.o. male who presents for medication refill.  Takes paxil 30 mg daily for anxiety, depression, and panic disorder.  Diagnosed with anxiety, depression, and panic disorder x 12 years.  Was seen by Dr. Gerda Diss, but then let go from practice.  Currently does not have a PCP.  Has been out of RX for about 4-5 days.  Complains of vertigo, hot flashes, and vivid dreams associated medication withdrawals. Denies fever, chills, vomiting, chest pain, SOB, abdominal pain, diarrhea, SI or HI.    ROS: As per HPI.  All other pertinent ROS negative.     Past Medical History:  Diagnosis Date  . Panic disorder   . Reactive airway disease   . Reflux    History reviewed. No pertinent surgical history. Allergies  Allergen Reactions  . Benadryl [Diphenhydramine Hcl]     Pt c/o it makes his head feel fuzzy and makes him panic.   No current facility-administered medications on file prior to encounter.   Current Outpatient Medications on File Prior to Encounter  Medication Sig Dispense Refill  . ALPRAZolam (XANAX) 1 MG tablet Take one half to one tab at bedtime as needed for anxiety. Caution drowsiness 30 tablet 1  . CONCERTA 27 MG CR tablet Take 1 tablet (27 mg total) by mouth every morning. 30 tablet 0  . valACYclovir (VALTREX) 1000 MG tablet Take 1 tablet (1,000 mg total) by mouth 3 (three) times daily. 21 tablet 0  . [DISCONTINUED] albuterol (PROVENTIL HFA;VENTOLIN HFA) 108 (90 Base) MCG/ACT inhaler Inhale 2 puffs into the lungs every 4 (four) hours as needed for wheezing. (Patient not taking: Reported on 02/06/2018) 1 Inhaler 2   Social History   Socioeconomic History  . Marital status: Single    Spouse name: Not on file  . Number of children: Not on file  . Years of education: Not on file  . Highest education level: Not on file  Occupational History  . Not on file    Tobacco Use  . Smoking status: Current Some Day Smoker  . Smokeless tobacco: Never Used  Substance and Sexual Activity  . Alcohol use: No  . Drug use: No  . Sexual activity: Not on file  Other Topics Concern  . Not on file  Social History Narrative  . Not on file   Social Determinants of Health   Financial Resource Strain:   . Difficulty of Paying Living Expenses: Not on file  Food Insecurity:   . Worried About Programme researcher, broadcasting/film/video in the Last Year: Not on file  . Ran Out of Food in the Last Year: Not on file  Transportation Needs:   . Lack of Transportation (Medical): Not on file  . Lack of Transportation (Non-Medical): Not on file  Physical Activity:   . Days of Exercise per Week: Not on file  . Minutes of Exercise per Session: Not on file  Stress:   . Feeling of Stress : Not on file  Social Connections:   . Frequency of Communication with Friends and Family: Not on file  . Frequency of Social Gatherings with Friends and Family: Not on file  . Attends Religious Services: Not on file  . Active Member of Clubs or Organizations: Not on file  . Attends Banker Meetings: Not on file  . Marital Status: Not on file  Intimate Partner Violence:   .  Fear of Current or Ex-Partner: Not on file  . Emotionally Abused: Not on file  . Physically Abused: Not on file  . Sexually Abused: Not on file   Family History  Problem Relation Age of Onset  . Hypertension Mother   . Healthy Father     OBJECTIVE:  Vitals:   07/02/19 1532  BP: 140/87  Pulse: 81  Resp: 17  Temp: 98.5 F (36.9 C)  TempSrc: Oral  SpO2: 98%    General appearance: alert; no distress Eyes: PERRLA; EOMI HENT: normocephalic; atraumatic; oropharynx clear Neck: supple with FROM Lungs: clear to auscultation bilaterally Heart: regular rate and rhythm.   Extremities: no edema; symmetrical with no gross deformities Skin: warm and dry Neurologic: CN 2-12 grossly intact; normal gait Psychological:  alert and cooperative; normal mood and affect   ASSESSMENT & PLAN:  1. Medication refill   2. Generalized anxiety disorder   3. Panic disorder     Meds ordered this encounter  Medications  . PARoxetine (PAXIL) 30 MG tablet    Sig: Take 1 tablet (30 mg total) by mouth daily.    Dispense:  90 tablet    Refill:  0    Order Specific Question:   Supervising Provider    Answer:   Raylene Everts [[id number]]    Rest and drink fluids Eat a well-balanced diet, and avoid excessive caffeine intake Some things you may try doing to help alleviate your symptoms include: keeping a journal, exercise, talking to a friend or relative, listening to music, going for a walk or hike outside, or other activities that you may find enjoyable Paxil refilled.  Take as directed.   Recommending further evaluation and management with PCP Return or go to ER if you have any new or worsening symptoms such as fever, chills, fatigue, worsening shortness of breath, wheezing, chest pain, nausea, vomiting, abdominal pain, changes in bowel or bladder habits, etc...   Reviewed expectations re: course of current medical issues. Questions answered. Outlined signs and symptoms indicating need for more acute intervention. Patient verbalized understanding. After Visit Summary given.   Lestine Box, PA-C 07/02/19 [id number]

## 2019-07-02 NOTE — ED Triage Notes (Signed)
Pt presents to UC for medication refill. Pt states he is unable to see his PCP at this time and unable to get a refill on his paxil. Pt states he feels that he is "having withdrawals from paxil" such as symptoms of vertigo, dizziness, hot flashes, vivid dreams, and nightsweats. Denies suicidal thoughts. Pt states he's been w/o paxil x4 days.

## 2019-07-02 NOTE — Discharge Instructions (Signed)
Rest and drink fluids Eat a well-balanced diet, and avoid excessive caffeine intake Some things you may try doing to help alleviate your symptoms include: keeping a journal, exercise, talking to a friend or relative, listening to music, going for a walk or hike outside, or other activities that you may find enjoyable Paxil refilled.  Take as directed.   Recommending further evaluation and management with PCP Return or go to ER if you have any new or worsening symptoms such as fever, chills, fatigue, worsening shortness of breath, wheezing, chest pain, nausea, vomiting, abdominal pain, changes in bowel or bladder habits, etc..Marland Kitchen

## 2020-01-08 ENCOUNTER — Ambulatory Visit (INDEPENDENT_AMBULATORY_CARE_PROVIDER_SITE_OTHER): Payer: Self-pay | Admitting: Internal Medicine

## 2020-01-08 ENCOUNTER — Encounter (INDEPENDENT_AMBULATORY_CARE_PROVIDER_SITE_OTHER): Payer: Self-pay | Admitting: Internal Medicine

## 2020-01-08 ENCOUNTER — Other Ambulatory Visit: Payer: Self-pay

## 2020-01-08 VITALS — BP 128/74 | HR 89 | Temp 98.1°F | Resp 18 | Ht 70.0 in | Wt 208.6 lb

## 2020-01-08 DIAGNOSIS — F909 Attention-deficit hyperactivity disorder, unspecified type: Secondary | ICD-10-CM

## 2020-01-08 DIAGNOSIS — F419 Anxiety disorder, unspecified: Secondary | ICD-10-CM

## 2020-01-08 DIAGNOSIS — F410 Panic disorder [episodic paroxysmal anxiety] without agoraphobia: Secondary | ICD-10-CM

## 2020-01-08 MED ORDER — CONCERTA 27 MG PO TBCR: 27.0000 mg | EXTENDED_RELEASE_TABLET | ORAL | 0 refills | Status: DC

## 2020-01-08 MED ORDER — PAROXETINE HCL 30 MG PO TABS: 30.0000 mg | ORAL_TABLET | Freq: Every day | ORAL | 3 refills | Status: DC

## 2020-01-08 NOTE — Progress Notes (Signed)
Metrics: Intervention Frequency ACO  Documented Smoking Status Yearly  Screened one or more times in 24 months  Cessation Counseling or  Active cessation medication Past 24 months  Past 24 months   Guideline developer: UpToDate (See UpToDate for funding source) Date Released: 2014       Wellness Office Visit  Subjective:  Patient ID: Jay Garcia, male    DOB: May 03, 1986  Age: 34 y.o. MRN: [id number]  CC: This 34 year old man comes to our practice as a new patient to establish care. HPI  His previous physician Dr. Lubertha South has since retired. He has a history of anxiety and panic disorder.  He also tends to have a depressive element. Concerta and Paxil seem to keep him in good shape mentally and he would like refills on this.  Past Medical History:  Diagnosis Date  . Panic disorder   . Reactive airway disease   . Reflux    History reviewed. No pertinent surgical history.   Family History  Problem Relation Age of Onset  . Hypertension Mother   . Drug abuse Mother   . Healthy Father   . Anxiety disorder Father   . Drug abuse Father   . Liver cancer Father     Social History   Social History Narrative   Lives with fiancee ,together for 1 year.Lives with fiancee and 2 kids.Divorced .Works for [employer] ,[employer],graduate.   Social History   Tobacco Use  . Smoking status: Current Some Day Smoker    Packs/day: 0.50    Types: Cigarettes  . Smokeless tobacco: Never Used  Substance Use Topics  . Alcohol use: Yes    Comment: occ    Current Meds  Medication Sig  . ALPRAZolam (XANAX) 1 MG tablet Take one half to one tab at bedtime as needed for anxiety. Caution drowsiness  . CONCERTA 27 MG CR tablet Take 1 tablet (27 mg total) by mouth every morning.  Marland Kitchen PARoxetine (PAXIL) 30 MG tablet Take 1 tablet (30 mg total) by mouth daily.  . valACYclovir (VALTREX) 1000 MG tablet Take 1 tablet (1,000 mg total) by mouth 3 (three) times  daily.  . [DISCONTINUED] CONCERTA 27 MG CR tablet Take 1 tablet (27 mg total) by mouth every morning.  . [DISCONTINUED] PARoxetine (PAXIL) 30 MG tablet Take 1 tablet (30 mg total) by mouth daily.      Depression screen [facility] 2/9 01/08/2020 02/06/2018  Decreased Interest 2 2  Down, Depressed, Hopeless 3 2  PHQ - 2 Score 5 4  Altered sleeping 3 3  Tired, decreased energy 3 3  Change in appetite 1 3  Feeling bad or failure about yourself  1 3  Trouble concentrating 2 3  Moving slowly or fidgety/restless 2 3  Suicidal thoughts 0 1  PHQ-9 Score 17 23  Difficult doing work/chores - Extremely dIfficult     Objective:   Today's Vitals: BP 128/74 (BP Location: Left Arm, Patient Position: Sitting, Cuff Size: Normal)   Pulse 89   Temp 98.1 F (36.7 C) (Temporal)   Resp 18   Ht 5\' 10"  (1.778 m)   Wt 208 lb 9.6 oz (94.6 kg)   SpO2 96%   BMI 29.93 kg/m  Vitals with BMI 01/08/2020 07/02/2019 02/06/2018  Height 5\' 10"  - 5\' 10"   Weight 208 lbs 10 oz - 180 lbs 6 oz  BMI 29.93 - 25.88  Systolic 128 140 -  Diastolic 74 87 -  Pulse 89 81 -  Physical Exam  He looks systemically well.  He is overweight, almost obese.     Assessment   1. Anxiety   2. Attention deficit hyperactivity disorder (ADHD), unspecified ADHD type   3. Panic disorder       Tests ordered No orders of the defined types were placed in this encounter.    Plan: 1. I have refilled his medications of Paxil and Concerta today. 2. I also discussed the importance of COVID-19 vaccination and I have urged him to do this as soon as possible.  I explained the science behind this. 3. Follow-up in 3 months for an annual physical exam.   Meds ordered this encounter  Medications  . PARoxetine (PAXIL) 30 MG tablet    Sig: Take 1 tablet (30 mg total) by mouth daily.    Dispense:  30 tablet    Refill:  3  . CONCERTA 27 MG CR tablet    Sig: Take 1 tablet (27 mg total) by mouth every morning.    Dispense:  30 tablet     Refill:  0    INSURANCE WILL ONLY PAY FOR BRAND NAME ONLY CONCERTA (ICD 10 Code is F90.2)    Wilson Singer, MD

## 2020-04-16 ENCOUNTER — Encounter (INDEPENDENT_AMBULATORY_CARE_PROVIDER_SITE_OTHER): Payer: Self-pay | Admitting: Internal Medicine

## 2020-05-20 ENCOUNTER — Other Ambulatory Visit (INDEPENDENT_AMBULATORY_CARE_PROVIDER_SITE_OTHER): Payer: Self-pay | Admitting: Internal Medicine

## 2020-06-10 ENCOUNTER — Other Ambulatory Visit (INDEPENDENT_AMBULATORY_CARE_PROVIDER_SITE_OTHER): Payer: Self-pay | Admitting: Internal Medicine

## 2020-06-10 ENCOUNTER — Telehealth (INDEPENDENT_AMBULATORY_CARE_PROVIDER_SITE_OTHER): Payer: Self-pay

## 2020-06-10 MED ORDER — PAROXETINE HCL 30 MG PO TABS: 30.0000 mg | ORAL_TABLET | Freq: Every day | ORAL | 0 refills | Status: DC

## 2020-06-10 NOTE — Telephone Encounter (Signed)
Please let the patient know that he had a no-show on the last appointment in December.  I have sent 1 month of the medication and no refills .  Please make him a follow-up appointment soon with Maralyn Sago.

## 2020-06-10 NOTE — Telephone Encounter (Signed)
Patient called and left a message that he is under quarantine and needs a refill of the following medication:  PARoxetine (PAXIL) 30 MG tablet Last filled 01/08/2020, # 30 with 3 refills  Last OV 01/08/2020

## 2020-06-10 NOTE — Telephone Encounter (Signed)
Called patient and scheduled him to see Dr. Karilyn Cota for a virtual/telephone appointment on 07/03/2020 for a follow up since Maralyn Sago did not have any openings until almost March. Patient stated that he has had a lot of changes in the past several months and recently both of his children tested positive for Covid and has been difficult. Patient verbalized an understanding.

## 2020-07-03 ENCOUNTER — Telehealth (INDEPENDENT_AMBULATORY_CARE_PROVIDER_SITE_OTHER): Payer: Self-pay | Admitting: Internal Medicine

## 2020-07-03 ENCOUNTER — Encounter (INDEPENDENT_AMBULATORY_CARE_PROVIDER_SITE_OTHER): Payer: Self-pay | Admitting: Internal Medicine

## 2020-07-03 DIAGNOSIS — F419 Anxiety disorder, unspecified: Secondary | ICD-10-CM

## 2020-07-03 MED ORDER — PAROXETINE HCL 30 MG PO TABS
30.0000 mg | ORAL_TABLET | Freq: Every day | ORAL | 3 refills | Status: DC
Start: 2020-07-03 — End: 2020-07-07

## 2020-07-03 NOTE — Progress Notes (Signed)
Metrics: Intervention Frequency ACO  Documented Smoking Status Yearly  Screened one or more times in 24 months  Cessation Counseling or  Active cessation medication Past 24 months  Past 24 months   Guideline developer: UpToDate (See UpToDate for funding source) Date Released: 2014       Wellness Office Visit  Subjective:  Patient ID: Jay Garcia, male    DOB: Nov 23, 1985  Age: 35 y.o. MRN: [id number]  CC: This is an audio telemedicine visit with the permission of the patient who is at home and I am in my office.  I used 2 identifiers to identify the patient. Refill of Paxil. HPI  The patient has a history of anxiety and panic disorder and takes Paxil for this.  At some point he was prescribed Concerta but this was too costly and he did not take this.  He feels that the Paxil is helping him.  He needs a refill for this medication.  He did not keep his appointment in December 2021 because his household had COVID-19 disease. Past Medical History:  Diagnosis Date  . Panic disorder   . Reactive airway disease   . Reflux    History reviewed. No pertinent surgical history.   Family History  Problem Relation Age of Onset  . Hypertension Mother   . Drug abuse Mother   . Healthy Father   . Anxiety disorder Father   . Drug abuse Father   . Liver cancer Father     Social History   Social History Narrative   Lives with fiancee ,together for 1 year.Lives with fiancee and 2 kids.Divorced .Works for [employer] ,[employer],graduate.   Social History   Tobacco Use  . Smoking status: Current Some Day Smoker    Packs/day: 0.50    Types: Cigarettes  . Smokeless tobacco: Never Used  Substance Use Topics  . Alcohol use: Yes    Comment: occ    Current Meds  Medication Sig  . PARoxetine (PAXIL) 30 MG tablet Take 1 tablet (30 mg total) by mouth daily.  . [DISCONTINUED] PARoxetine (PAXIL) 30 MG tablet Take 1 tablet (30 mg total) by mouth daily.      Flowsheet Row Office Visit from 01/08/2020 in [facility]  PHQ-9 Total Score 17      Objective:   Today's Vitals: There were no vitals taken for this visit. Vitals with BMI 07/03/2020 01/08/2020 07/02/2019  Height (No Data) 5\' 10"  -  Weight (No Data) 208 lbs 10 oz -  BMI - 29.93 -  Systolic (No Data) 128 140  Diastolic (No Data) 74 87  Pulse - 89 81     Physical Exam   Virtual visit.  Appears to be alert and orientated.    Assessment   1. Anxiety       Tests ordered No orders of the defined types were placed in this encounter.    Plan: 1. I have refilled his Paxil today and have made an appointment for him to come in to see for an annual physical exam in a couple of months. 2. This phone call lasted 5 minutes   Meds ordered this encounter  Medications  . PARoxetine (PAXIL) 30 MG tablet    Sig: Take 1 tablet (30 mg total) by mouth daily.    Dispense:  30 tablet    Refill:  3    Javel Hersh Maralyn Sago, MD

## 2020-07-07 ENCOUNTER — Other Ambulatory Visit (INDEPENDENT_AMBULATORY_CARE_PROVIDER_SITE_OTHER): Payer: Self-pay | Admitting: Internal Medicine

## 2020-09-04 ENCOUNTER — Encounter (INDEPENDENT_AMBULATORY_CARE_PROVIDER_SITE_OTHER): Payer: Self-pay | Admitting: Nurse Practitioner

## 2020-09-30 ENCOUNTER — Encounter (INDEPENDENT_AMBULATORY_CARE_PROVIDER_SITE_OTHER): Payer: Self-pay | Admitting: Nurse Practitioner

## 2020-10-23 ENCOUNTER — Encounter (INDEPENDENT_AMBULATORY_CARE_PROVIDER_SITE_OTHER): Payer: Self-pay | Admitting: Nurse Practitioner

## 2020-10-23 ENCOUNTER — Ambulatory Visit (INDEPENDENT_AMBULATORY_CARE_PROVIDER_SITE_OTHER): Payer: Self-pay | Admitting: Nurse Practitioner

## 2021-03-30 ENCOUNTER — Other Ambulatory Visit: Payer: Self-pay

## 2021-03-30 ENCOUNTER — Ambulatory Visit
Admission: RE | Admit: 2021-03-30 | Discharge: 2021-03-30 | Disposition: A | Discharge status: Home / Self Care | Payer: Self-pay | Source: Ambulatory Visit | Attending: Family Medicine | Admitting: Family Medicine

## 2021-03-30 VITALS — BP 128/87 | HR 84 | Temp 98.4°F | Resp 18

## 2021-03-30 DIAGNOSIS — F419 Anxiety disorder, unspecified: Secondary | ICD-10-CM

## 2021-03-30 MED ORDER — PAROXETINE HCL 30 MG PO TABS: ORAL_TABLET | ORAL | 1 refills | Status: DC

## 2021-03-30 NOTE — ED Provider Notes (Signed)
[facility]    CSN: [id number] Arrival date & time: 03/30/21  1647      History   Chief Complaint No chief complaint on file.   HPI Jay Garcia is a 35 y.o. male.   Patient presenting today requesting a medication refill on his Paxil that he takes for anxiety disorder.  He states he has been out for 5 days because his primary care provider passed away and he has not been able to establish care somewhere else in time to get a refill.  States has been experiencing significant withdrawal symptoms from being off the medication after taking it consistently for 12 years.  Denies any side effects while on the medication and states he controls his anxiety symptoms well.  Denies suicidal or homicidal ideation at this time.   Past Medical History:  Diagnosis Date   Panic disorder    Reactive airway disease    Reflux     Patient Active Problem List   Diagnosis Date Noted   Proctitis 09/18/2017   Anxiety 12/16/2015   Gastroesophageal reflux disease without esophagitis 05/14/2015   Reactive airway disease 11/13/2012   ADHD (attention deficit hyperactivity disorder) 10/16/2012   Panic disorder 10/16/2012    History reviewed. No pertinent surgical history.     Home Medications    Prior to Admission medications   Medication Sig Start Date End Date Taking? Authorizing Provider  PARoxetine (PAXIL) 30 MG tablet TAKE 1 TABLET(30 MG) BY MOUTH DAILY 03/30/21   Particia Nearing, PA-C  albuterol (PROVENTIL HFA;VENTOLIN HFA) 108 (90 Base) MCG/ACT inhaler Inhale 2 puffs into the lungs every 4 (four) hours as needed for wheezing. Patient not taking: Reported on 02/06/2018 06/21/17 07/02/19  Babs Sciara, MD    Family History Family History  Problem Relation Age of Onset   Hypertension Mother    Drug abuse Mother    Healthy Father    Anxiety disorder Father    Drug abuse Father    Liver cancer Father     Social History Social History   Tobacco Use   Smoking  status: Some Days    Packs/day: 0.50    Types: Cigarettes   Smokeless tobacco: Never  Substance Use Topics   Alcohol use: Yes    Comment: occ   Drug use: No     Allergies   Benadryl [diphenhydramine hcl]   Review of Systems Review of Systems Per HPI  Physical Exam Triage Vital Signs ED Triage Vitals  Enc Vitals Group     BP 03/30/21 1739 128/87     Pulse Rate 03/30/21 1739 (!) 107     Resp 03/30/21 1739 18     Temp 03/30/21 1739 98.4 F (36.9 C)     Temp Source 03/30/21 1739 Oral     SpO2 03/30/21 1739 97 %     Weight --      Height --      Head Circumference --      Peak Flow --      Pain Score 03/30/21 1737 0     Pain Loc --      Pain Edu? --      Excl. in GC? --    No data found.  Updated Vital Signs BP 128/87 (BP Location: Right Arm)   Pulse 84   Temp 98.4 F (36.9 C) (Oral)   Resp 18   SpO2 98%   Visual Acuity Right Eye Distance:   Left Eye Distance:   Bilateral  Distance:    Right Eye Near:   Left Eye Near:    Bilateral Near:     Physical Exam Vitals and nursing note reviewed.  Constitutional:      Appearance: Normal appearance.  HENT:     Head: Atraumatic.  Eyes:     Extraocular Movements: Extraocular movements intact.     Conjunctiva/sclera: Conjunctivae normal.  Cardiovascular:     Rate and Rhythm: Normal rate and regular rhythm.  Pulmonary:     Effort: Pulmonary effort is normal.     Breath sounds: Normal breath sounds.  Musculoskeletal:        General: Normal range of motion.     Cervical back: Normal range of motion and neck supple.  Skin:    General: Skin is warm and dry.  Neurological:     General: No focal deficit present.     Mental Status: He is oriented to person, place, and time.  Psychiatric:        Mood and Affect: Mood normal.        Thought Content: Thought content normal.        Judgment: Judgment normal.     UC Treatments / Results  Labs (all labs ordered are listed, but only abnormal results are  displayed) Labs Reviewed - No data to display  EKG   Radiology No results found.  Procedures Procedures (including critical care time)  Medications Ordered in UC Medications - No data to display  Initial Impression / Assessment and Plan / UC Course  I have reviewed the triage vital signs and the nursing notes.  Pertinent labs & imaging results that were available during my care of the patient were reviewed by me and considered in my medical decision making (see chart for details).     Paxil refill sent, establish with PCP soon as able but may return here for further refills in the meantime.  Follow-up with any side effects or concerns in the meantime.  Final Clinical Impressions(s) / UC Diagnoses   Final diagnoses:  Anxiety disorder, unspecified type   Discharge Instructions   None    ED Prescriptions     Medication Sig Dispense Auth. Provider   PARoxetine (PAXIL) 30 MG tablet TAKE 1 TABLET(30 MG) BY MOUTH DAILY 90 tablet Particia Nearing, [place]      PDMP not reviewed this encounter.   Particia Nearing, [place] 03/30/21 1806

## 2021-03-30 NOTE — ED Triage Notes (Signed)
Patient states his PCP passed away and he was on Paxil. His anxiety is at an all time high and he is not able to sleep. He would like to have a refill.

## 2021-07-01 ENCOUNTER — Ambulatory Visit (INDEPENDENT_AMBULATORY_CARE_PROVIDER_SITE_OTHER): Payer: Self-pay | Admitting: Internal Medicine

## 2021-07-01 ENCOUNTER — Encounter: Payer: Self-pay | Admitting: Internal Medicine

## 2021-07-01 ENCOUNTER — Other Ambulatory Visit: Payer: Self-pay

## 2021-07-01 ENCOUNTER — Encounter (INDEPENDENT_AMBULATORY_CARE_PROVIDER_SITE_OTHER): Payer: Self-pay

## 2021-07-01 VITALS — BP 124/86 | HR 78 | Resp 16 | Ht 70.0 in | Wt 199.0 lb

## 2021-07-01 DIAGNOSIS — F909 Attention-deficit hyperactivity disorder, unspecified type: Secondary | ICD-10-CM

## 2021-07-01 DIAGNOSIS — F419 Anxiety disorder, unspecified: Secondary | ICD-10-CM

## 2021-07-01 DIAGNOSIS — Z2821 Immunization not carried out because of patient refusal: Secondary | ICD-10-CM

## 2021-07-01 DIAGNOSIS — J4520 Mild intermittent asthma, uncomplicated: Secondary | ICD-10-CM

## 2021-07-01 DIAGNOSIS — E782 Mixed hyperlipidemia: Secondary | ICD-10-CM

## 2021-07-01 DIAGNOSIS — F410 Panic disorder [episodic paroxysmal anxiety] without agoraphobia: Secondary | ICD-10-CM

## 2021-07-01 DIAGNOSIS — Z720 Tobacco use: Secondary | ICD-10-CM | POA: Insufficient documentation

## 2021-07-01 MED ORDER — ALBUTEROL SULFATE HFA 108 (90 BASE) MCG/ACT IN AERS: 2.0000 | INHALATION_SPRAY | Freq: Four times a day (QID) | RESPIRATORY_TRACT | 2 refills | Status: DC | PRN

## 2021-07-01 MED ORDER — PAROXETINE HCL 30 MG PO TABS: 30.0000 mg | ORAL_TABLET | Freq: Every day | ORAL | 1 refills | Status: DC

## 2021-07-01 NOTE — Assessment & Plan Note (Signed)
Smokes about 1 pack/day  Asked about quitting: confirms that he/she currently smokes cigarettes Advise to quit smoking: Educated about QUITTING to reduce the risk of cancer, cardio and cerebrovascular disease. Assess willingness: Unwilling to quit at this time, but is working on cutting back. Assist with counseling and pharmacotherapy: Counseled for 5 minutes and literature provided. Arrange for follow up: follow up in 3 months and continue to offer help. 

## 2021-07-01 NOTE — Assessment & Plan Note (Signed)
Was on Concerta in the past, was started by Dr. Evelene Croon Prefers to restart it once he gets insurance

## 2021-07-01 NOTE — Assessment & Plan Note (Signed)
Prescribed albuterol inhaler PRN, coupon provided

## 2021-07-01 NOTE — Assessment & Plan Note (Signed)
Denies any recent panic episode Takes Paxil for GAD 

## 2021-07-01 NOTE — Progress Notes (Signed)
New Patient Office Visit  Subjective:  Patient ID: Jay Garcia, male    DOB: Oct 18, 1985  Age: 36 y.o. MRN: [id number]  CC:  Chief Complaint  Patient presents with   New Patient (Initial Visit)    New patient was seeing gosrani just establishing care    HPI Jay Garcia is a 36 y.o. male with past medical history of GAD and ADHD who presents for establishing care.  He has been taking Paxil for GAD/panic disorder.  He used to follow-up with psychiatry-Dr. Toy Care in the past.  She was placed on Concerta for ADHD in the past, but he was not able to continue it due to cost concern.  He currently denies anhedonia, SI or HI currently.  He does report difficulty concentrating, but denies any episodes of agitation or hyper activity.  He has history of reactive airway disease, for which she used to take albuterol inhaler on as needed basis for dyspnea or wheezing.  He has not been able to get it due to cost concern.  Denies any wheezing currently.  He has not had flu or COVID-vaccine.  Past Medical History:  Diagnosis Date   Panic disorder    Reactive airway disease    Reflux     History reviewed. No pertinent surgical history.  Family History  Problem Relation Age of Onset   Hypertension Mother    Drug abuse Mother    Healthy Father    Anxiety disorder Father    Drug abuse Father    Liver cancer Father     Social History   Socioeconomic History   Marital status: Single    Spouse name: Not on file   Number of children: Not on file   Years of education: Not on file   Highest education level: Not on file  Occupational History   Not on file  Tobacco Use   Smoking status: Some Days    Packs/day: 0.50    Types: Cigarettes   Smokeless tobacco: Never  Substance and Sexual Activity   Alcohol use: Yes    Comment: occ   Drug use: No   Sexual activity: Yes  Other Topics Concern   Not on file  Social History Narrative   Lives with fiancee ,together for 1 year.Lives  with fiancee and 2 kids.Divorced .Works for [employer].College ,[employer] and [employer],graduate.   Social Determinants of Health   Financial Resource Strain: Not on file  Food Insecurity: Not on file  Transportation Needs: Not on file  Physical Activity: Not on file  Stress: Not on file  Social Connections: Not on file  Intimate Partner Violence: Not on file    ROS Review of Systems  Constitutional:  Negative for chills and fever.  HENT:  Negative for congestion and sore throat.   Eyes:  Negative for pain and discharge.  Respiratory:  Positive for shortness of breath. Negative for cough.   Cardiovascular:  Negative for chest pain and palpitations.  Gastrointestinal:  Negative for constipation, diarrhea, nausea and vomiting.  Endocrine: Negative for polydipsia and polyuria.  Genitourinary:  Negative for dysuria and hematuria.  Musculoskeletal:  Negative for neck pain and neck stiffness.  Skin:  Negative for rash.  Neurological:  Negative for dizziness, weakness, numbness and headaches.  Psychiatric/Behavioral:  Positive for decreased concentration. Negative for agitation and behavioral problems. The patient is nervous/anxious.    Objective:   Today's Vitals: BP 124/86 (BP Location: Right Arm, Patient Position: Sitting, Cuff Size: Normal)  Pulse 78    Resp 16    Ht 5\' 10"  (1.778 m)    Wt 199 lb 0.6 oz (90.3 kg)    SpO2 97%    BMI 28.56 kg/m   Physical Exam Vitals reviewed.  Constitutional:      General: He is not in acute distress.    Appearance: He is not diaphoretic.  HENT:     Head: Normocephalic and atraumatic.     Nose: Nose normal.     Mouth/Throat:     Mouth: Mucous membranes are moist.  Eyes:     General: No scleral icterus.    Extraocular Movements: Extraocular movements intact.  Cardiovascular:     Rate and Rhythm: Normal rate and regular rhythm.     Pulses: Normal pulses.     Heart sounds: Normal heart sounds. No murmur heard. Pulmonary:      Breath sounds: Normal breath sounds. No wheezing or rales.  Musculoskeletal:     Cervical back: Neck supple. No tenderness.     Right lower leg: No edema.     Left lower leg: No edema.  Skin:    General: Skin is warm.     Findings: No rash.  Neurological:     General: No focal deficit present.     Mental Status: He is alert and oriented to person, place, and time.     Sensory: No sensory deficit.     Motor: No weakness.  Psychiatric:        Mood and Affect: Mood normal.        Behavior: Behavior normal.    Assessment & Plan:   Problem List Items Addressed This Visit       Respiratory   Reactive airway disease    Prescribed albuterol inhaler PRN, coupon provided      Relevant Medications   albuterol (VENTOLIN HFA) 108 (90 Base) MCG/ACT inhaler   Other Relevant Orders   CBC   Basic Metabolic Panel (BMET)     Other   ADHD (attention deficit hyperactivity disorder)    Was on Concerta in the past, was started by Dr. Toy Care Prefers to restart it once he gets insurance      Relevant Orders   CBC   Basic Metabolic Panel (BMET)   Panic disorder - Primary    Denies any recent panic episode Takes Paxil for GAD      Relevant Medications   PARoxetine (PAXIL) 30 MG tablet   Anxiety    Well controlled with Paxil, refilled      Relevant Medications   PARoxetine (PAXIL) 30 MG tablet   Tobacco abuse    Smokes about 1 pack/day  Asked about quitting: confirms that he/she currently smokes cigarettes Advise to quit smoking: Educated about QUITTING to reduce the risk of cancer, cardio and cerebrovascular disease. Assess willingness: Unwilling to quit at this time, but is working on cutting back. Assist with counseling and pharmacotherapy: Counseled for 5 minutes and literature provided. Arrange for follow up: follow up in 3 months and continue to offer help.      Other Visit Diagnoses     Mixed hyperlipidemia       Relevant Orders   Lipid Profile   Refused influenza  vaccine           Outpatient Encounter Medications as of 07/01/2021  Medication Sig   albuterol (VENTOLIN HFA) 108 (90 Base) MCG/ACT inhaler Inhale 2 puffs into the lungs every 6 (six) hours as needed for wheezing or  shortness of breath.   [DISCONTINUED] PARoxetine (PAXIL) 30 MG tablet TAKE 1 TABLET(30 MG) BY MOUTH DAILY   PARoxetine (PAXIL) 30 MG tablet Take 1 tablet (30 mg total) by mouth daily.   [DISCONTINUED] albuterol (PROVENTIL HFA;VENTOLIN HFA) 108 (90 Base) MCG/ACT inhaler Inhale 2 puffs into the lungs every 4 (four) hours as needed for wheezing. (Patient not taking: Reported on 02/06/2018)   No facility-administered encounter medications on file as of 07/01/2021.    Follow-up: Return in about 6 months (around 12/29/2021) for Televisit for anxiety.   Lindell Spar, MD

## 2021-07-01 NOTE — Patient Instructions (Addendum)
Please continue to take medications as prescribed.  Please use Albuterol inhaler as needed for shortness of breath or wheezing.  Please try to cut down -> quit smoking.

## 2021-07-01 NOTE — Assessment & Plan Note (Signed)
Well controlled with Paxil, refilled 

## 2021-10-28 ENCOUNTER — Ambulatory Visit (INDEPENDENT_AMBULATORY_CARE_PROVIDER_SITE_OTHER): Payer: Self-pay | Admitting: Family Medicine

## 2021-10-28 ENCOUNTER — Encounter: Payer: Self-pay | Admitting: Family Medicine

## 2021-10-28 VITALS — BP 111/70 | HR 77 | Resp 16 | Ht 70.0 in | Wt 197.8 lb

## 2021-10-28 DIAGNOSIS — B029 Zoster without complications: Secondary | ICD-10-CM | POA: Insufficient documentation

## 2021-10-28 DIAGNOSIS — Z720 Tobacco use: Secondary | ICD-10-CM

## 2021-10-28 MED ORDER — ACYCLOVIR 800 MG PO TABS: 800.0000 mg | ORAL_TABLET | Freq: Every day | ORAL | 0 refills | Status: DC

## 2021-10-28 NOTE — Assessment & Plan Note (Addendum)
Left nostril x 2 days, acyclovir prescribed

## 2021-10-28 NOTE — Patient Instructions (Addendum)
F/U wit Dr Allena Katz as before, call if you need to be seen sooner  Acyclovir is prescribed for  10 days, please take entire course, you are being treated for shingles affecting left nostril  Continue to contemplate/ work on cutting back cigarettes  Thanks for choosing [facility], we consider it a privelige to serve you.

## 2021-11-09 ENCOUNTER — Encounter: Payer: Self-pay | Admitting: Family Medicine

## 2021-12-29 ENCOUNTER — Telehealth: Payer: Self-pay | Admitting: Internal Medicine

## 2022-01-19 ENCOUNTER — Ambulatory Visit (INDEPENDENT_AMBULATORY_CARE_PROVIDER_SITE_OTHER): Payer: Self-pay | Admitting: Internal Medicine

## 2022-01-19 ENCOUNTER — Encounter: Payer: Self-pay | Admitting: Internal Medicine

## 2022-01-19 DIAGNOSIS — J45909 Unspecified asthma, uncomplicated: Secondary | ICD-10-CM

## 2022-01-19 DIAGNOSIS — Z720 Tobacco use: Secondary | ICD-10-CM

## 2022-01-19 DIAGNOSIS — J4520 Mild intermittent asthma, uncomplicated: Secondary | ICD-10-CM

## 2022-01-19 DIAGNOSIS — F410 Panic disorder [episodic paroxysmal anxiety] without agoraphobia: Secondary | ICD-10-CM

## 2022-01-19 DIAGNOSIS — F411 Generalized anxiety disorder: Secondary | ICD-10-CM

## 2022-01-19 DIAGNOSIS — F17210 Nicotine dependence, cigarettes, uncomplicated: Secondary | ICD-10-CM

## 2022-01-19 MED ORDER — PAROXETINE HCL 30 MG PO TABS: 30.0000 mg | ORAL_TABLET | Freq: Every day | ORAL | 1 refills | Status: DC

## 2022-01-19 NOTE — Assessment & Plan Note (Signed)
Well-controlled with Albuterol PRN 

## 2022-01-19 NOTE — Assessment & Plan Note (Signed)
Well controlled with Paxil, refilled 

## 2022-01-19 NOTE — Progress Notes (Signed)
Virtual Visit via Telephone Note   This visit type was conducted due to national recommendations for restrictions regarding the COVID-19 Pandemic (e.g. social distancing) in an effort to limit this patient's exposure and mitigate transmission in our community.  Due to his co-morbid illnesses, this patient is at least at moderate risk for complications without adequate follow up.  This format is felt to be most appropriate for this patient at this time.  The patient did not have access to video technology/had technical difficulties with video requiring transitioning to audio format only (telephone).  All issues noted in this document were discussed and addressed.  No physical exam could be performed with this format.  Evaluation Performed:  Follow-up visit  Date:  01/19/2022   ID:  MASSON NALEPA, DOB 01-Jul-1985, MRN [id number]  Patient Location: Home Provider Location: Office/Clinic  Participants: Patient Location of Patient: Home Location of Provider: Telehealth Consent was obtain for visit to be over via telehealth. I verified that I am speaking with the correct person using two identifiers.  PCP:  Anabel Halon, MD   Chief Complaint: Follow-up of anxiety  History of Present Illness:    Jay Garcia is a 36 y.o. male who has a televisit for follow up of anxiety/panic disorder.  He has been tolerating Paxil well.  Denies any recent panic episode.  Denies any anhedonia, SI or HI currently.  He used to follow-up with psychiatry-Dr. Evelene Croon in the past.  He was placed on Concerta for ADHD in the past, but he was not able to continue it due to cost concern.    He has history of reactive airway disease, for which he uses albuterol inhaler on as needed basis for dyspnea or wheezing.  The patient does not have symptoms concerning for COVID-19 infection (fever, chills, cough, or new shortness of breath).   Past Medical, Surgical, Social History, Allergies, and Medications have been  Reviewed.  Past Medical History:  Diagnosis Date   Panic disorder    Reactive airway disease    Reflux    History reviewed. No pertinent surgical history.   Current Meds  Medication Sig   acyclovir (ZOVIRAX) 800 MG tablet Take 1 tablet (800 mg total) by mouth 5 (five) times daily.   albuterol (VENTOLIN HFA) 108 (90 Base) MCG/ACT inhaler Inhale 2 puffs into the lungs every 6 (six) hours as needed for wheezing or shortness of breath.   PARoxetine (PAXIL) 30 MG tablet Take 1 tablet (30 mg total) by mouth daily.     Allergies:   Benadryl [diphenhydramine hcl]   ROS:   Please see the history of present illness.     All other systems reviewed and are negative.   Labs/Other Tests and Data Reviewed:    Recent Labs: No results found for requested labs within last 365 days.   Recent Lipid Panel No results found for: "CHOL", "TRIG", "HDL", "CHOLHDL", "LDLCALC", "LDLDIRECT"  Wt Readings from Last 3 Encounters:  10/28/21 197 lb 12.8 oz (89.7 kg)  07/01/21 199 lb 0.6 oz (90.3 kg)  01/08/20 208 lb 9.6 oz (94.6 kg)     ASSESSMENT & PLAN:    Tobacco abuse Smokes about 0.75 pack/day  Asked about quitting: confirms that he/she currently smokes cigarettes Advise to quit smoking: Educated about QUITTING to reduce the risk of cancer, cardio and cerebrovascular disease. Assess willingness: Unwilling to quit at this time, but is working on cutting back. Assist with counseling and pharmacotherapy: Counseled for 5 minutes  and literature provided. Arrange for follow up: follow up in 3 months and continue to offer help.  GAD (generalized anxiety disorder) Well controlled with Paxil, refilled  Panic disorder Denies any recent panic episode Takes Paxil for GAD  Reactive airway disease Well-controlled with Albuterol PRN   Time:   Today, I have spent 9 minutes reviewing the chart, including problem list, medications, and with the patient with telehealth technology discussing the above  problems.   Medication Adjustments/Labs and Tests Ordered: Current medicines are reviewed at length with the patient today.  Concerns regarding medicines are outlined above.   Tests Ordered: No orders of the defined types were placed in this encounter.   Medication Changes: No orders of the defined types were placed in this encounter.    Note: This dictation was prepared with Dragon dictation along with smaller phrase technology. Similar sounding words can be transcribed inadequately or may not be corrected upon review. Any transcriptional errors that result from this process are unintentional.      Disposition:  Follow up  Signed, Anabel Halon, MD  01/19/2022 10:33 AM     [facility]

## 2022-01-19 NOTE — Assessment & Plan Note (Signed)
Denies any recent panic episode Takes Paxil for GAD

## 2022-01-19 NOTE — Assessment & Plan Note (Signed)
Smokes about 0.75 pack/day  Asked about quitting: confirms that he/she currently smokes cigarettes Advise to quit smoking: Educated about QUITTING to reduce the risk of cancer, cardio and cerebrovascular disease. Assess willingness: Unwilling to quit at this time, but is working on cutting back. Assist with counseling and pharmacotherapy: Counseled for 5 minutes and literature provided. Arrange for follow up: follow up in 3 months and continue to offer help.

## 2022-01-24 ENCOUNTER — Other Ambulatory Visit: Payer: Self-pay | Admitting: Internal Medicine

## 2022-01-24 DIAGNOSIS — F410 Panic disorder [episodic paroxysmal anxiety] without agoraphobia: Secondary | ICD-10-CM

## 2022-07-20 ENCOUNTER — Ambulatory Visit: Payer: Self-pay | Admitting: Internal Medicine

## 2022-08-11 ENCOUNTER — Encounter: Payer: Self-pay | Admitting: Internal Medicine

## 2022-08-11 ENCOUNTER — Telehealth (INDEPENDENT_AMBULATORY_CARE_PROVIDER_SITE_OTHER): Payer: Self-pay | Admitting: Internal Medicine

## 2022-08-11 DIAGNOSIS — J4520 Mild intermittent asthma, uncomplicated: Secondary | ICD-10-CM

## 2022-08-11 DIAGNOSIS — F411 Generalized anxiety disorder: Secondary | ICD-10-CM

## 2022-08-11 DIAGNOSIS — F410 Panic disorder [episodic paroxysmal anxiety] without agoraphobia: Secondary | ICD-10-CM

## 2022-08-11 MED ORDER — PAROXETINE HCL 30 MG PO TABS: 30.0000 mg | ORAL_TABLET | Freq: Every day | ORAL | 1 refills | Status: DC

## 2022-08-11 NOTE — Assessment & Plan Note (Signed)
Well-controlled with Albuterol PRN 

## 2022-08-11 NOTE — Assessment & Plan Note (Signed)
Well controlled Denies any recent panic episode Takes Paxil for GAD

## 2022-08-11 NOTE — Assessment & Plan Note (Signed)
Well controlled with Paxil, refilled

## 2022-08-11 NOTE — Progress Notes (Signed)
Virtual Visit via Video Note   Because of Jay Garcia's co-morbid illnesses, he is at least at moderate risk for complications without adequate follow up.  This format is felt to be most appropriate for this patient at this time.  All issues noted in this document were discussed and addressed.  A limited physical exam was performed with this format.      Evaluation Performed:  Follow-up visit  Date:  08/11/2022   ID:  Jay Garcia, DOB [birth year], MRN [id number]  Patient Location: Home Provider Location: Office/Clinic  Participants: Patient Location of Patient: Home Location of Provider: Telehealth Consent was obtain for visit to be over via telehealth. I verified that I am speaking with the correct person using two identifiers.  PCP:  Lindell Spar, MD   Chief Complaint: Follow-up of anxiety  History of Present Illness:    Jay Garcia is a 37 y.o. male with PMH of GAD and ADHD who has a video visit for follow up of anxiety/panic disorder.  He has been tolerating Paxil well.  Denies any recent panic episode.  Denies any anhedonia, SI or HI currently.  He used to follow-up with psychiatry-Dr. Toy Care in the past.  He was placed on Concerta for ADHD in the past, but he was not able to continue it due to cost concern.  He denies any agitation or difficulty in concentration currently.  The patient does not have symptoms concerning for COVID-19 infection (fever, chills, cough, or new shortness of breath).   Past Medical, Surgical, Social History, Allergies, and Medications have been Reviewed.  Past Medical History:  Diagnosis Date   Panic disorder    Reactive airway disease    Reflux    No past surgical history on file.   No outpatient medications have been marked as taking for the 08/11/22 encounter (Video Visit) with Lindell Spar, MD.     Allergies:   Benadryl [diphenhydramine hcl]   ROS:   Please see the history of present illness.     All other systems  reviewed and are negative.   Labs/Other Tests and Data Reviewed:    Recent Labs: No results found for requested labs within last 365 days.   Recent Lipid Panel No results found for: "CHOL", "TRIG", "HDL", "CHOLHDL", "LDLCALC", "LDLDIRECT"  Wt Readings from Last 3 Encounters:  10/28/21 197 lb 12.8 oz (89.7 kg)  07/01/21 199 lb 0.6 oz (90.3 kg)  01/08/20 208 lb 9.6 oz (94.6 kg)     Objective:    Vital Signs:  There were no vitals taken for this visit.   VITAL SIGNS:  reviewed GEN:  no acute distress EYES:  sclerae anicteric, EOMI - Extraocular Movements Intact RESPIRATORY:  normal respiratory effort, symmetric expansion NEURO:  alert and oriented x 3, no obvious focal deficit PSYCH:  normal affect  ASSESSMENT & PLAN:    Panic disorder Well controlled Denies any recent panic episode Takes Paxil for GAD  GAD (generalized anxiety disorder) Well controlled with Paxil, refilled  Reactive airway disease Well-controlled with Albuterol PRN   I discussed the assessment and treatment plan with the patient. The patient was provided an opportunity to ask questions, and all were answered. The patient agreed with the plan and demonstrated an understanding of the instructions.   The patient was advised to call back or seek an in-person evaluation if the symptoms worsen or if the condition fails to improve as anticipated.  The above assessment and management plan  was discussed with the patient. The patient verbalized understanding of and has agreed to the management plan.   Medication Adjustments/Labs and Tests Ordered: Current medicines are reviewed at length with the patient today.  Concerns regarding medicines are outlined above.   Tests Ordered: No orders of the defined types were placed in this encounter.   Medication Changes: Meds ordered this encounter  Medications   PARoxetine (PAXIL) 30 MG tablet    Sig: Take 1 tablet (30 mg total) by mouth daily.    Dispense:  90  tablet    Refill:  1     Note: This dictation was prepared with Dragon dictation along with smaller phrase technology. Similar sounding words can be transcribed inadequately or may not be corrected upon review. Any transcriptional errors that result from this process are unintentional.      Disposition:  Follow up  Signed, Lindell Spar, MD  08/11/2022 3:00 PM     [facility]

## 2022-09-05 ENCOUNTER — Other Ambulatory Visit: Payer: Self-pay | Admitting: Internal Medicine

## 2022-09-05 DIAGNOSIS — F410 Panic disorder [episodic paroxysmal anxiety] without agoraphobia: Secondary | ICD-10-CM

## 2023-04-20 ENCOUNTER — Other Ambulatory Visit: Payer: Self-pay | Admitting: Internal Medicine

## 2023-04-20 DIAGNOSIS — F410 Panic disorder [episodic paroxysmal anxiety] without agoraphobia: Secondary | ICD-10-CM

## 2023-10-14 ENCOUNTER — Other Ambulatory Visit: Payer: Self-pay | Admitting: Internal Medicine

## 2023-10-14 DIAGNOSIS — F410 Panic disorder [episodic paroxysmal anxiety] without agoraphobia: Secondary | ICD-10-CM

## 2023-10-17 ENCOUNTER — Telehealth: Payer: Self-pay

## 2023-10-17 NOTE — Telephone Encounter (Signed)
 Appt scheduled , patient added to wait list for sooner appt

## 2023-10-17 NOTE — Telephone Encounter (Signed)
 Copied from CRM [id number]. Topic: Appointments - Scheduling Inquiry for Clinic >> Oct 17, 2023 10:48 AM Sasha H wrote: Reason for CRM: pt states he is out of PARoxetine  (PAXIL ) 30 MG tablet and doe not have any refills left. I tried to get him scheduled but there is nothing available till next week. Pt is worried he is going to go into withdrawal.

## 2023-10-24 ENCOUNTER — Telehealth: Payer: Self-pay

## 2023-10-24 DIAGNOSIS — F410 Panic disorder [episodic paroxysmal anxiety] without agoraphobia: Secondary | ICD-10-CM

## 2023-10-24 MED ORDER — PAROXETINE HCL 30 MG PO TABS: 30.0000 mg | ORAL_TABLET | Freq: Every day | ORAL | 1 refills | Status: DC

## 2023-10-24 NOTE — Assessment & Plan Note (Signed)
 Well controlled Denies any recent panic episode Takes Paxil  for GAD Recommend follow-up in 6 months or sooner if needed.

## 2023-10-24 NOTE — Progress Notes (Signed)
   Virtual Visit via Video Note  I connected with Jay Garcia on 10/24/23 at  8:40 AM EDT by a video enabled telemedicine application and verified that I am speaking with the correct person using two identifiers.  Patient Location: Home Provider Location: Office/Clinic  I discussed the limitations, risks, security, and privacy concerns of performing an evaluation and management service by video and the availability of in person appointments. I also discussed with the patient that there may be a patient responsible charge related to this service. The patient expressed understanding and agreed to proceed.  Subjective: PCP: Meldon Sport, MD  Chief Complaint  Patient presents with   Medical Management of Chronic Issues    Pt states "No other concerns just medication refills"   HPI   ROS: Per HPI  Current Outpatient Medications:    PARoxetine  (PAXIL ) 30 MG tablet, Take 1 tablet (30 mg total) by mouth daily., Disp: 90 tablet, Rfl: 1  Observations/Objective: There were no vitals filed for this visit. Physical Exam  Assessment and Plan: Panic disorder Assessment & Plan: Well controlled Denies any recent panic episode Takes Paxil  for GAD Recommend follow-up in 6 months or sooner if needed.  Orders: -     PARoxetine  HCl; Take 1 tablet (30 mg total) by mouth daily.  Dispense: 90 tablet; Refill: 1    Follow Up Instructions: No follow-ups on file.   I discussed the assessment and treatment plan with the patient. The patient was provided an opportunity to ask questions, and all were answered. The patient agreed with the plan and demonstrated an understanding of the instructions.   The patient was advised to call back or seek an in-person evaluation if the symptoms worsen or if the condition fails to improve as anticipated.  The above assessment and management plan was discussed with the patient. The patient verbalized understanding of and has agreed to the management plan.    Alison Irvine, FNP

## 2024-06-21 ENCOUNTER — Other Ambulatory Visit: Payer: Self-pay

## 2024-06-21 DIAGNOSIS — F410 Panic disorder [episodic paroxysmal anxiety] without agoraphobia: Secondary | ICD-10-CM
